# Patient Record
Sex: Female | Born: 1955 | Race: White | Hispanic: No | Marital: Married | Smoking: Never smoker
Health system: Southern US, Community
[De-identification: ages and names within clinical notes are randomized; demographics above are authoritative.]

## PROBLEM LIST (undated history)

## (undated) DIAGNOSIS — M1990 Unspecified osteoarthritis, unspecified site: Secondary | ICD-10-CM

## (undated) DIAGNOSIS — T7840XA Allergy, unspecified, initial encounter: Secondary | ICD-10-CM

## (undated) DIAGNOSIS — J329 Chronic sinusitis, unspecified: Secondary | ICD-10-CM

## (undated) DIAGNOSIS — J302 Other seasonal allergic rhinitis: Secondary | ICD-10-CM

## (undated) DIAGNOSIS — J3089 Other allergic rhinitis: Secondary | ICD-10-CM

## (undated) DIAGNOSIS — J45909 Unspecified asthma, uncomplicated: Secondary | ICD-10-CM

## (undated) HISTORY — DX: Unspecified osteoarthritis, unspecified site: M19.90

## (undated) HISTORY — DX: Other seasonal allergic rhinitis: J30.2

## (undated) HISTORY — DX: Allergy, unspecified, initial encounter: T78.40XA

## (undated) HISTORY — DX: Unspecified asthma, uncomplicated: J45.909

## (undated) HISTORY — PX: KNEE CARTILAGE SURGERY: SHX688

---

## 2003-03-18 ENCOUNTER — Other Ambulatory Visit: Admission: RE | Admit: 2003-03-18 | Discharge: 2003-03-18 | Payer: Self-pay | Admitting: Obstetrics and Gynecology

## 2003-03-25 ENCOUNTER — Encounter: Payer: Self-pay | Admitting: Obstetrics and Gynecology

## 2003-03-25 ENCOUNTER — Encounter: Admission: RE | Admit: 2003-03-25 | Discharge: 2003-03-25 | Payer: Self-pay | Admitting: Obstetrics and Gynecology

## 2004-04-30 ENCOUNTER — Encounter: Admission: RE | Admit: 2004-04-30 | Discharge: 2004-04-30 | Payer: Self-pay | Admitting: Obstetrics and Gynecology

## 2010-09-05 ENCOUNTER — Encounter: Payer: Self-pay | Admitting: Obstetrics and Gynecology

## 2012-08-15 ENCOUNTER — Telehealth: Payer: Self-pay

## 2012-08-15 NOTE — Telephone Encounter (Signed)
Pt is checking on refill request of advair  Please call  [phone]

## 2012-08-16 NOTE — Telephone Encounter (Signed)
Needs appt, not seen in 1 yr left message for her to advise.

## 2012-08-18 ENCOUNTER — Ambulatory Visit (INDEPENDENT_AMBULATORY_CARE_PROVIDER_SITE_OTHER): Payer: BC Managed Care – PPO | Admitting: Family Medicine

## 2012-08-18 VITALS — BP 122/89 | HR 80 | Temp 98.3°F | Resp 18 | Ht 68.0 in | Wt 155.0 lb

## 2012-08-18 DIAGNOSIS — J45909 Unspecified asthma, uncomplicated: Secondary | ICD-10-CM

## 2012-08-18 DIAGNOSIS — J309 Allergic rhinitis, unspecified: Secondary | ICD-10-CM | POA: Insufficient documentation

## 2012-08-18 MED ORDER — FLUTICASONE-SALMETEROL 100-50 MCG/DOSE IN AEPB: 1.0000 | INHALATION_SPRAY | Freq: Two times a day (BID) | RESPIRATORY_TRACT | Status: DC

## 2012-08-18 NOTE — Progress Notes (Signed)
[address]   [place], [place]  [address]   [phone]  Subjective:    Patient ID: Mary Humphrey, female    DOB: October 14, 1955, 57 y.o.   MRN: [id number]  HPIThis 57 y.o. female presents for follow-up on the following:  1.  Asthma:  Worsens in the winter. Uses intermittently; uses fall and winter.  Asthma two years; diagnosed due to coughing excessively.  Has Albuterol rarely.  Exercise avoiding due to coughing.  No flu vaccine; never has received flu vaccine.  No tobacco.  Sister with childhood asthma; did not have issues until moving to [facility].   Does not perform Peak Flows; not aware of baseline.  Recovering from recent URI.  Cold weather also exacerbates coughing.    2.  Allergic rhinitis: stable with Zyrtec D.  No recent issues; allergies worse in fall and spring.  Worsening allergy symptoms when moved to [place] two years ago.  PCP: [facility]; scheduled for CPE on 08/28/12.   Review of Systems  Constitutional: Negative for fever, chills, diaphoresis and fatigue.  HENT: Negative for ear pain, congestion, sore throat, rhinorrhea, sneezing, mouth sores, trouble swallowing, voice change, postnasal drip, sinus pressure and ear discharge.   Respiratory: Positive for cough. Negative for chest tightness, shortness of breath, wheezing and stridor.   Cardiovascular: Positive for chest pain. Negative for palpitations.        Past Medical History  Diagnosis Date  . Allergy   . Asthma     No past surgical history on file.  Prior to Admission medications   Medication Sig Start Date End Date Taking? Authorizing Provider  cetirizine (ZYRTEC) 10 MG tablet Take 10 mg by mouth daily.   Yes Historical Provider, MD  Fluticasone-Salmeterol (ADVAIR) 100-50 MCG/DOSE AEPB Inhale 1 puff into the lungs every 12 (twelve) hours. 08/18/12  Yes Ethelda Chick, MD    No Known Allergies  History   Social History  . Marital Status: Single    Spouse Name: N/A    Number of Children: N/A  . Years of Education: N/A     Occupational History  . Not on file.   Social History Main Topics  . Smoking status: Never Smoker   . Smokeless tobacco: Not on file  . Alcohol Use: Yes  . Drug Use: No  . Sexually Active: Yes    Birth Control/ Protection: Post-menopausal   Other Topics Concern  . Not on file   Social History Narrative   Marital status:  Married x 33 years   Children: two children; no grandchildren   Lives: with husband   Employment:  Chiropodist at [employer]  Tobacco: none   Alcohol:  Socially three times weekly   Exercise: none    Family History  Problem Relation Age of Onset  . Asthma Sister     Objective:   Physical Exam  Nursing note and vitals reviewed. Constitutional: She is oriented to person, place, and time. She appears well-developed and well-nourished. No distress.  HENT:  Head: Normocephalic and atraumatic.  Right Ear: External ear normal.  Left Ear: External ear normal.  Nose: Nose normal.  Mouth/Throat: Oropharynx is clear and moist.  Eyes: Conjunctivae normal are normal. Pupils are equal, round, and reactive to light.  Neck: Normal range of motion. Neck supple.  Cardiovascular: Normal rate, regular rhythm and normal heart sounds.   No murmur heard. Pulmonary/Chest: Effort normal and breath sounds normal. No respiratory distress. She has no wheezes. She has no rales.  Musculoskeletal: She  exhibits no edema.  Lymphadenopathy:    She has no cervical adenopathy.  Neurological: She is alert and oriented to person, place, and time.  Skin: She is not diaphoretic.  Psychiatric: She has a normal mood and affect. Her behavior is normal.    PEAK FLOWS: 360, 360, 360      Assessment & Plan:   1. Asthma  Fluticasone-Salmeterol (ADVAIR) 100-50 MCG/DOSE AEPB  2. Allergic rhinitis      1.  Asthma:  Mild intermittent.   Educated on chronic disease state.  Pt declined flu vaccine.  Encouraged regular exercise; encourage use of Advair or Albuterol prior to exercise.   2.   Allergic Rhinitis: Stable; continue Zyrtec daily.  No recent issues.    Meds ordered this encounter  Medications  . DISCONTD: Fluticasone-Salmeterol (ADVAIR) 100-50 MCG/DOSE AEPB    Sig: Inhale 1 puff into the lungs every 12 (twelve) hours.  . cetirizine (ZYRTEC) 10 MG tablet    Sig: Take 10 mg by mouth daily.  . Fluticasone-Salmeterol (ADVAIR) 100-50 MCG/DOSE AEPB    Sig: Inhale 1 puff into the lungs every 12 (twelve) hours.    Dispense:  60 each    Refill:  11

## 2012-08-18 NOTE — Patient Instructions (Addendum)
1. Asthma  Fluticasone-Salmeterol (ADVAIR) 100-50 MCG/DOSE AEPB  2. Allergic rhinitis

## 2012-09-01 ENCOUNTER — Encounter: Payer: Self-pay | Admitting: Family Medicine

## 2012-09-22 ENCOUNTER — Encounter: Payer: BC Managed Care – PPO | Admitting: Family Medicine

## 2012-10-07 ENCOUNTER — Ambulatory Visit (INDEPENDENT_AMBULATORY_CARE_PROVIDER_SITE_OTHER): Payer: BC Managed Care – PPO | Admitting: Emergency Medicine

## 2012-10-07 VITALS — BP 133/86 | HR 78 | Temp 98.1°F | Resp 16 | Ht 66.5 in | Wt 155.0 lb

## 2012-10-07 DIAGNOSIS — L0100 Impetigo, unspecified: Secondary | ICD-10-CM

## 2012-10-07 MED ORDER — MUPIROCIN CALCIUM 2 % EX CREA: TOPICAL_CREAM | Freq: Three times a day (TID) | CUTANEOUS | Status: DC

## 2012-10-07 MED ORDER — SULFAMETHOXAZOLE-TRIMETHOPRIM 800-160 MG PO TABS: 1.0000 | ORAL_TABLET | Freq: Two times a day (BID) | ORAL | Status: DC

## 2012-10-07 NOTE — Patient Instructions (Addendum)
Impetigo  Impetigo is an infection of the skin, most common in babies and children.   CAUSES   It is caused by staphylococcal or streptococcal germs (bacteria). Impetigo can start after any damage to the skin. The damage to the skin may be from things like:    Chickenpox.   Scrapes.   Scratches.   Insect bites (common when children scratch the bite).   Cuts.   Nail biting or chewing.  Impetigo is contagious. It can be spread from one person to another. Avoid close skin contact, or sharing towels or clothing.  SYMPTOMS   Impetigo usually starts out as small blisters or pustules. Then they turn into tiny yellow-crusted sores (lesions).   There may also be:   Large blisters.   Itching or pain.   Pus.   Swollen lymph glands.  With scratching, irritation, or non-treatment, these small areas may get larger. Scratching can cause the germs to get under the fingernails; then scratching another part of the skin can cause the infection to be spread there.  DIAGNOSIS   Diagnosis of impetigo is usually made by a physical exam. A skin culture (test to grow bacteria) may be done to prove the diagnosis or to help decide the best treatment.   TREATMENT   Mild impetigo can be treated with prescription antibiotic cream. Oral antibiotic medicine may be used in more severe cases. Medicines for itching may be used.  HOME CARE INSTRUCTIONS    To avoid spreading impetigo to other body areas:   Keep fingernails short and clean.   Avoid scratching.   Cover infected areas if necessary to keep from scratching.   Gently wash the infected areas with antibiotic soap and water.   Soak crusted areas in warm soapy water using antibiotic soap.   Gently rub the areas to remove crusts. Do not scrub.   Wash hands often to avoid spread this infection.   Keep children with impetigo home from school or daycare until they have used an antibiotic cream for 48 hours (2 days) or oral antibiotic medicine for 24 hours (1 day), and their skin  shows significant improvement.   Children may attend school or daycare if they only have a few sores and if the sores can be covered by a bandage or clothing.  SEEK MEDICAL CARE IF:    More blisters or sores show up despite treatment.   Other family members get sores.   Rash is not improving after 48 hours (2 days) of treatment.  SEEK IMMEDIATE MEDICAL CARE IF:    You see spreading redness or swelling of the skin around the sores.   You see red streaks coming from the sores.   Your child develops a fever of 100.4 F (37.2 C) or higher.   Your child develops a sore throat.   Your child is acting ill (lethargic, sick to their stomach).  Document Released: 07/30/2000 Document Revised: 10/25/2011 Document Reviewed: 05/29/2008  Exit[employer] Patient Information 2013 [employer].

## 2012-10-07 NOTE — Progress Notes (Signed)
[facility] [address] [place] [address] [phone]  Date:  10/07/2012   Name:  Mary Humphrey   DOB:  10/17/1955   MRN:  [id number]  PCP:  Norberto Sorenson, MD    Chief Complaint: Rash   History of Present Illness:  Mary Humphrey is a 57 y.o. very pleasant female patient who presents with the following:  Had a pruritic lesions on her legs that have become infected over the past month.  No pain or fever or chills.  No drainage.  No antecedent injury or infection.  Patient Active Problem List  Diagnosis  . Asthma  . Allergic rhinitis    Past Medical History  Diagnosis Date  . Allergy   . Asthma     No past surgical history on file.  History  Substance Use Topics  . Smoking status: Never Smoker   . Smokeless tobacco: Not on file  . Alcohol Use: Yes    Family History  Problem Relation Age of Onset  . Asthma Sister     No Known Allergies  Medication list has been reviewed and updated.  Current Outpatient Prescriptions on File Prior to Visit  Medication Sig Dispense Refill  . cetirizine (ZYRTEC) 10 MG tablet Take 10 mg by mouth daily.      . Fluticasone-Salmeterol (ADVAIR) 100-50 MCG/DOSE AEPB Inhale 1 puff into the lungs every 12 (twelve) hours.  60 each  11   No current facility-administered medications on file prior to visit.    Review of Systems:  As per HPI, otherwise negative.    Physical Examination: Filed Vitals:   10/07/12 1155  BP: 133/86  Pulse: 78  Temp: 98.1 F (36.7 C)  Resp: 16   Filed Vitals:   10/07/12 1155  Height: 5' 6.5" (1.689 m)  Weight: 155 lb (70.308 kg)   Body mass index is 24.65 kg/(m^2). Ideal Body Weight: Weight in (lb) to have BMI = 25: 156.9   GEN: WDWN, NAD, Non-toxic, Alert & Oriented x 3 HEENT: Atraumatic, Normocephalic.  Ears and Nose: No external deformity. EXTR: No clubbing/cyanosis/edema NEURO: Normal gait.  PSYCH: Normally interactive. Conversant. Not depressed or anxious  appearing.  Calm demeanor.  SKIN:  Lesions characteristic of impetigo  Assessment and Plan: Impetigo Septra ds Arlyce Harman, MD

## 2012-10-09 ENCOUNTER — Telehealth: Payer: Self-pay

## 2012-10-09 DIAGNOSIS — L0100 Impetigo, unspecified: Secondary | ICD-10-CM

## 2012-10-09 MED ORDER — DOXYCYCLINE HYCLATE 100 MG PO CAPS: 100.0000 mg | ORAL_CAPSULE | Freq: Two times a day (BID) | ORAL | Status: DC

## 2012-10-09 NOTE — Telephone Encounter (Signed)
medcines prescribed recently are upsetting patient stomach, she would like something more gentle   [facility] [address]   cbn  [phone]

## 2012-10-13 ENCOUNTER — Ambulatory Visit: Payer: BC Managed Care – PPO | Admitting: Family Medicine

## 2012-10-26 ENCOUNTER — Telehealth: Payer: Self-pay

## 2012-10-26 DIAGNOSIS — J309 Allergic rhinitis, unspecified: Secondary | ICD-10-CM

## 2012-10-26 NOTE — Telephone Encounter (Signed)
Pt would like to be referred to an allergist [phone]

## 2012-10-29 NOTE — Telephone Encounter (Signed)
Please make the referral

## 2012-10-30 NOTE — Telephone Encounter (Signed)
Referral made. Patient advised.

## 2012-11-17 ENCOUNTER — Encounter: Payer: BC Managed Care – PPO | Admitting: Family Medicine

## 2012-12-19 ENCOUNTER — Other Ambulatory Visit: Payer: Self-pay | Admitting: Emergency Medicine

## 2013-12-14 ENCOUNTER — Ambulatory Visit (INDEPENDENT_AMBULATORY_CARE_PROVIDER_SITE_OTHER): Payer: BC Managed Care – PPO | Admitting: Family Medicine

## 2013-12-14 ENCOUNTER — Encounter: Payer: Self-pay | Admitting: Family Medicine

## 2013-12-14 VITALS — BP 120/76 | HR 62 | Temp 97.7°F | Resp 16 | Ht 66.5 in | Wt 161.2 lb

## 2013-12-14 DIAGNOSIS — L303 Infective dermatitis: Secondary | ICD-10-CM

## 2013-12-14 DIAGNOSIS — L0889 Other specified local infections of the skin and subcutaneous tissue: Secondary | ICD-10-CM

## 2013-12-14 MED ORDER — MUPIROCIN CALCIUM 2 % EX CREA: TOPICAL_CREAM | Freq: Three times a day (TID) | CUTANEOUS | Status: DC

## 2013-12-14 MED ORDER — DESONIDE 0.05 % EX OINT: 1.0000 "application " | TOPICAL_OINTMENT | Freq: Two times a day (BID) | CUTANEOUS | Status: DC

## 2013-12-14 NOTE — Patient Instructions (Addendum)
Eczema Eczema, also called atopic dermatitis, is a skin disorder that causes inflammation of the skin. It causes a red rash and dry, scaly skin. The skin becomes very itchy. Eczema is generally worse during the cooler winter months and often improves with the warmth of summer. Eczema usually starts showing signs in infancy. Some children outgrow eczema, but it may last through adulthood.  CAUSES  The exact cause of eczema is not known, but it appears to run in families. People with eczema often have a family history of eczema, allergies, asthma, or hay fever. Eczema is not contagious. Flare-ups of the condition may be caused by:   Contact with something you are sensitive or allergic to.   Stress. SIGNS AND SYMPTOMS  Dry, scaly skin.   Red, itchy rash.   Itchiness. This may occur before the skin rash and may be very intense.  DIAGNOSIS  The diagnosis of eczema is usually made based on symptoms and medical history. TREATMENT  Eczema cannot be cured, but symptoms usually can be controlled with treatment and other strategies. A treatment plan might include:  Controlling the itching and scratching.   Use over-the-counter antihistamines as directed for itching. This is especially useful at night when the itching tends to be worse.   Use over-the-counter steroid creams as directed for itching.   Avoid scratching. Scratching makes the rash and itching worse. It may also result in a skin infection (impetigo) due to a break in the skin caused by scratching.   Keeping the skin well moisturized with creams every day. This will seal in moisture and help prevent dryness. Lotions that contain alcohol and water should be avoided because they can dry the skin.   Limiting exposure to things that you are sensitive or allergic to (allergens).   Recognizing situations that cause stress.   Developing a plan to manage stress.  HOME CARE INSTRUCTIONS   Only take over-the-counter or  prescription medicines as directed by your health care provider.   Do not use anything on the skin without checking with your health care provider.   Keep baths or showers short (5 minutes) in warm (not hot) water. Use mild cleansers for bathing. These should be unscented. You may add nonperfumed bath oil to the bath water. It is best to avoid soap and bubble bath.   Immediately after a bath or shower, when the skin is still damp, apply a moisturizing ointment to the entire body. This ointment should be a petroleum ointment. This will seal in moisture and help prevent dryness. The thicker the ointment, the better. These should be unscented.   Keep fingernails cut short. Children with eczema may need to wear soft gloves or mittens at night after applying an ointment.   Dress in clothes made of cotton or cotton blends. Dress lightly, because heat increases itching.   A child with eczema should stay away from anyone with fever blisters or cold sores. The virus that causes fever blisters (herpes simplex) can cause a serious skin infection in children with eczema. SEEK MEDICAL CARE IF:   Your itching interferes with sleep.   Your rash gets worse or is not better within 1 week after starting treatment.   You see pus or soft yellow scabs in the rash area.   You have a fever.   You have a rash flare-up after contact with someone who has fever blisters.  Document Released: 07/30/2000 Document Revised: 05/23/2013 Document Reviewed: 03/05/2013 [facility] Patient Information 2014 [place].  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  Use this topical steroid ointment very sparingly and use mild soap or cleansing agent to prevent drying of affected skin.

## 2013-12-14 NOTE — Progress Notes (Signed)
S: This 58 y.o. Cauc female has a recurrent rash for last 12+ months on anterior aspect of lower legs. Previous episode 3 months ago was diagnosed as impetigo and improved after oral antibiotic (and topical antibiotic cream. Recurrence 1 month ago w/ itching and redness. No hx of eczema as a child. No exposure to pets, plants or chemicals.   Pt has allergic diathesis; she receives allergy injections for severe allergies and has moderate Asthma. She denies fever/chills, fatigue, sore throat, cough, SOB, HA, arthralgias or weakness.  Patient Active Problem List   Diagnosis Date Noted  . Asthma 08/18/2012  . Allergic rhinitis 08/18/2012    Prior to Admission medications   Medication Sig Start Date End Date Taking? Authorizing Provider  albuterol (PROVENTIL HFA;VENTOLIN HFA) 108 (90 BASE) MCG/ACT inhaler Inhale into the lungs every 6 (six) hours as needed for wheezing or shortness of breath.   Yes Historical Provider, MD  azelastine (ASTELIN) 0.1 % nasal spray Place into both nostrils 2 (two) times daily. Use in each nostril as directed   Yes Historical Provider, MD  cetirizine (ZYRTEC) 10 MG tablet Take 10 mg by mouth daily.   Yes Historical Provider, MD  EPINEPHrine 0.3 mg/0.3 mL IJ SOAJ injection Inject into the muscle once.   Yes Historical Provider, MD  fluticasone (FLONASE) 50 MCG/ACT nasal spray Place into both nostrils daily.   Yes Historical Provider, MD  Fluticasone-Salmeterol (ADVAIR) 100-50 MCG/DOSE AEPB Inhale 1 puff into the lungs every 12 (twelve) hours. 08/18/12  Yes Wardell Honour, MD  Montelukast Sodium (SINGULAIR PO) Take by mouth.   Yes Historical Provider, MD  Olopatadine HCl (PATADAY) 0.2 % SOLN Apply to eye.   Yes Historical Provider, MD          PMHx, Surg Hx, Soc and Fam Hx reviewed.  ROS: As per HPI.  O: Filed Vitals:   12/14/13 1028  BP: 120/76  Pulse: 62  Temp: 97.7 F (36.5 C)  Resp: 16   GEN: In NAD; WN,WD. HENT: [place]/AT; EOMI w/ clear conj/sclerae. Otherwise  unremarkable. COR: RRR. LUNGS: Unlabored resp. SKIN: R lower leg- maculopapular rash with erythema and ill-defined edge. Hyperpigmentation in areas of previous outbreak. L lower leg- hyperpigmented scarring and few faint red papules. NEURO: A&O x 3; CNs intact. Nonfocal.  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 Apply sparingly bid. Keep skin hydrated.

## 2014-04-03 ENCOUNTER — Other Ambulatory Visit (INDEPENDENT_AMBULATORY_CARE_PROVIDER_SITE_OTHER): Payer: Self-pay | Admitting: Otolaryngology

## 2014-04-03 DIAGNOSIS — J329 Chronic sinusitis, unspecified: Secondary | ICD-10-CM

## 2014-04-09 ENCOUNTER — Ambulatory Visit
Admission: RE | Admit: 2014-04-09 | Discharge: 2014-04-09 | Disposition: A | Discharge status: Home / Self Care | Payer: BC Managed Care – PPO | Source: Ambulatory Visit | Attending: Otolaryngology | Admitting: Otolaryngology

## 2014-04-09 DIAGNOSIS — J329 Chronic sinusitis, unspecified: Secondary | ICD-10-CM

## 2014-04-17 ENCOUNTER — Encounter: Payer: Self-pay | Admitting: Family Medicine

## 2014-04-17 ENCOUNTER — Ambulatory Visit (INDEPENDENT_AMBULATORY_CARE_PROVIDER_SITE_OTHER): Payer: BC Managed Care – PPO | Admitting: Family Medicine

## 2014-04-17 VITALS — BP 120/90 | HR 73 | Temp 97.6°F | Resp 16 | Ht 66.5 in | Wt 161.6 lb

## 2014-04-17 DIAGNOSIS — Z13220 Encounter for screening for lipoid disorders: Secondary | ICD-10-CM

## 2014-04-17 DIAGNOSIS — Z1212 Encounter for screening for malignant neoplasm of rectum: Secondary | ICD-10-CM

## 2014-04-17 DIAGNOSIS — Z124 Encounter for screening for malignant neoplasm of cervix: Secondary | ICD-10-CM

## 2014-04-17 DIAGNOSIS — Z0000 Encounter for general adult medical examination without abnormal findings: Secondary | ICD-10-CM

## 2014-04-17 DIAGNOSIS — Z01419 Encounter for gynecological examination (general) (routine) without abnormal findings: Secondary | ICD-10-CM

## 2014-04-17 DIAGNOSIS — Z1211 Encounter for screening for malignant neoplasm of colon: Secondary | ICD-10-CM

## 2014-04-17 DIAGNOSIS — Z8639 Personal history of other endocrine, nutritional and metabolic disease: Secondary | ICD-10-CM

## 2014-04-17 LAB — POCT URINALYSIS DIPSTICK
Bilirubin, UA: NEGATIVE
Glucose, UA: NEGATIVE
Ketones, UA: NEGATIVE
Nitrite, UA: NEGATIVE
Protein, UA: NEGATIVE
Spec Grav, UA: 1.01
Urobilinogen, UA: 0.2
pH, UA: 5.5

## 2014-04-17 LAB — POCT UA - MICROSCOPIC ONLY
Casts, Ur, LPF, POC: NEGATIVE
Crystals, Ur, HPF, POC: NEGATIVE
Epithelial cells, urine per micros: NEGATIVE
Mucus, UA: NEGATIVE
Yeast, UA: NEGATIVE

## 2014-04-17 NOTE — Progress Notes (Signed)
Subjective:    Patient ID: Mary Humphrey, female    DOB: 12-06-1955, 58 y.o.   MRN: [id number]  HPI  This 58 y.o. Cauc female is here for CPE/ PAP w/ pelvic. Most significant chronic medical problem is nasal polyps associated with allergies and chronic sinusitis; this problem is managed by Allergy Specialist and ENT.   HCM: MMG- Current.           CRS- Not yet; agrees to referral to GI.           IMM- Current.           Vision- Annually or biannually; wears corrective lenses.   Patient Active Problem List   Diagnosis Date Noted  . 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 Asthma 08/18/2012  . Allergic rhinitis 08/18/2012    Prior to Admission medications   Medication Sig Start Date End Date Taking? Authorizing Provider  albuterol (PROVENTIL HFA;VENTOLIN HFA) 108 (90 BASE) MCG/ACT inhaler Inhale into the lungs every 6 (six) hours as needed for wheezing or shortness of breath.   Yes Historical Provider, MD  azelastine (ASTELIN) 0.1 % nasal spray Place into both nostrils 2 (two) times daily. Use in each nostril as directed   Yes Historical Provider, MD  cetirizine (ZYRTEC) 10 MG tablet Take 10 mg by mouth daily.   Yes Historical Provider, MD  desonide (DESOWEN) 0.05 % ointment Apply 1 application topically 2 (two) times daily. 12/14/13  Yes Barton Fanny, MD  EPINEPHrine 0.3 mg/0.3 mL IJ SOAJ injection Inject into the muscle once.   Yes Historical Provider, MD  fluticasone (FLONASE) 50 MCG/ACT nasal spray Place into both nostrils daily.   Yes Historical Provider, MD  Fluticasone-Salmeterol (ADVAIR) 100-50 MCG/DOSE AEPB Inhale 1 puff into the lungs every 12 (twelve) hours. 08/18/12  Yes Wardell Honour, MD  Montelukast Sodium (SINGULAIR PO) Take by mouth.   Yes Historical Provider, MD  Olopatadine HCl (PATADAY) 0.2 % SOLN Apply to eye.    Historical Provider, MD    History   Social History  . Marital Status: Single    Spouse Name: N/A    Number of Children: N/A  . Years of Education: N/A    Occupational History  . Administration [employer]   Social History Main Topics  . Smoking status: Never Smoker   . Smokeless tobacco: Not on file  . Alcohol Use: Yes     Comment: 2-3 drinks  . Drug Use: No  . Sexual Activity: Yes    Birth Control/ Protection: Post-menopausal   Other Topics Concern  . Not on file   Social History Narrative   Marital status:  Married x 33 years      Children: two children; no grandchildren      Lives: with husband      Employment:  Surveyor, quantity at [employer]     Tobacco: none      Alcohol:  Socially three times weekly      Exercise: none    Family History  Problem Relation Age of Onset  . Asthma Sister   . Heart disease Father          Review of Systems  Constitutional: Negative.   HENT: Negative.   Eyes: Negative.   Respiratory: Negative.   Cardiovascular: Negative.   Gastrointestinal: Negative.   Endocrine: Negative.   Genitourinary: Negative.   Musculoskeletal: Negative.   Skin: Negative.   Allergic/Immunologic: Positive for environmental allergies and food allergies.  Neurological: Negative.   Hematological: Negative.  Psychiatric/Behavioral: Negative.       Objective:   Physical Exam  Nursing note and vitals reviewed. Constitutional: She is oriented to person, place, and time. Vital signs are normal. She appears well-developed and well-nourished. No distress.  HENT:  Head: Normocephalic and atraumatic.  Right Ear: Hearing, tympanic membrane, external ear and ear canal normal.  Left Ear: Hearing, tympanic membrane, external ear and ear canal normal.  Nose: Nose normal. No mucosal edema, rhinorrhea, nasal deformity or septal deviation.  Mouth/Throat: Uvula is midline, oropharynx is clear and moist and mucous membranes are normal. No oral lesions. Normal dentition. No dental caries.  Eyes: Conjunctivae, EOM and lids are normal. Pupils are equal, round, and reactive to light. No scleral icterus.  Neck: Trachea normal, normal  range of motion, full passive range of motion without pain and phonation normal. Neck supple. No JVD present. No spinous process tenderness and no muscular tenderness present. No mass and no thyromegaly present.  Cardiovascular: Normal rate, regular rhythm, S1 normal, S2 normal, normal heart sounds and normal pulses.   No extrasystoles are present. PMI is not displaced.  Exam reveals no gallop and no friction rub.   No murmur heard. Pulmonary/Chest: Effort normal and breath sounds normal. No respiratory distress. Right breast exhibits no inverted nipple, no mass, no nipple discharge, no skin change and no tenderness. Left breast exhibits no inverted nipple, no mass, no nipple discharge, no skin change and no tenderness. Breasts are symmetrical.  Abdominal: Soft. Normal appearance, normal aorta and bowel sounds are normal. She exhibits no distension, no pulsatile midline mass and no mass. There is no hepatosplenomegaly. There is no tenderness. There is no guarding and no CVA tenderness.  Genitourinary: Rectum normal, vagina normal and uterus normal. There is no rash, tenderness or lesion on the right labia. There is no rash, tenderness or lesion on the left labia. Uterus is not deviated, not enlarged, not fixed and not tender. Cervix exhibits discharge. Cervix exhibits no motion tenderness. Right adnexum displays no mass, no tenderness and no fullness. Left adnexum displays no mass, no tenderness and no fullness.  Musculoskeletal:       Cervical back: Normal.       Thoracic back: Normal.       Lumbar back: Normal.  Remainder of exam normal.  Lymphadenopathy:       Head (right side): No submental, no submandibular, no tonsillar, no preauricular, no posterior auricular and no occipital adenopathy present.       Head (left side): No submental, no submandibular, no tonsillar, no preauricular, no posterior auricular and no occipital adenopathy present.    She has no cervical adenopathy.    She has no  axillary adenopathy.       Right: No inguinal and no supraclavicular adenopathy present.       Left: No inguinal and no supraclavicular adenopathy present.  Neurological: She is alert and oriented to person, place, and time. She has normal strength and normal reflexes. She displays no atrophy and no tremor. No cranial nerve deficit or sensory deficit. She exhibits normal muscle tone. She displays a negative Romberg sign. Coordination and gait normal.  Skin: Skin is warm, dry and intact. No ecchymosis, no lesion and no rash noted. She is not diaphoretic. No cyanosis or erythema. No pallor. Nails show no clubbing.  Psychiatric: She has a normal mood and affect. Her speech is normal and behavior is normal. Judgment and thought content normal. Cognition and memory are normal.    Results  for orders placed in visit on 04/17/14  POCT URINALYSIS DIPSTICK      Result Value Ref Range   Color, UA yellow     Clarity, UA clear     Glucose, UA neg     Bilirubin, UA neg     Ketones, UA neg     Spec Grav, UA 1.010     Blood, UA trace     pH, UA 5.5     Protein, UA neg     Urobilinogen, UA 0.2     Nitrite, UA neg     Leukocytes, UA Trace         Assessment & Plan:  Routine general medical examination at a health care facility - Plan: Thyroid Panel With TSH, COMPLETE METABOLIC PANEL WITH GFR, CBC with Differential, IFOBT POC (occult bld, rslt in office), POCT UA - Microscopic Only, POCT urinalysis dipstick  Encounter for cervical Pap smear with pelvic exam - Plan: Pap IG (Image Guided)  History of vitamin D deficiency - Plan: Vit D  25 hydroxy (rtn osteoporosis monitoring)  Need for lipid screening - Plan: Lipid panel  Screening for colorectal cancer - Plan: Ambulatory referral to Gastroenterology

## 2014-04-17 NOTE — Patient Instructions (Addendum)
Mary Humphrey has a line of multivitamins that he has formulated. You can purchase these online as [email or web address]. He has formulations for Women, Post-Menopausal Women and other lines depending on your particular physical conditions. Also, Omega 3 Fish Oil is important for heart health and overall well-being. [employer] is a good brand or you can try the [facility] brand (samples given).

## 2014-04-17 NOTE — Progress Notes (Deleted)
   Subjective:    Patient ID: Mary Humphrey, female    DOB: 07/25/1956, 58 y.o.   MRN: [id number]  HPI  This 58 y.o. Cauc female is here for CPE/PAP.     Review of Systems     Objective:   Physical Exam        Assessment & Plan:

## 2014-04-18 LAB — CBC WITH DIFFERENTIAL/PLATELET
Basophils Absolute: 0.1 10*3/uL (ref 0.0–0.1)
Basophils Relative: 1 % (ref 0–1)
Eosinophils Absolute: 0.4 10*3/uL (ref 0.0–0.7)
Eosinophils Relative: 7 % — ABNORMAL HIGH (ref 0–5)
HCT: 40.1 % (ref 36.0–46.0)
Hemoglobin: 13.4 g/dL (ref 12.0–15.0)
Lymphocytes Relative: 34 % (ref 12–46)
Lymphs Abs: 1.8 10*3/uL (ref 0.7–4.0)
MCH: 30.2 pg (ref 26.0–34.0)
MCHC: 33.4 g/dL (ref 30.0–36.0)
MCV: 90.5 fL (ref 78.0–100.0)
Monocytes Absolute: 0.4 10*3/uL (ref 0.1–1.0)
Monocytes Relative: 8 % (ref 3–12)
Neutro Abs: 2.7 10*3/uL (ref 1.7–7.7)
Neutrophils Relative %: 50 % (ref 43–77)
Platelets: 271 10*3/uL (ref 150–400)
RBC: 4.43 MIL/uL (ref 3.87–5.11)
RDW: 13.7 % (ref 11.5–15.5)
WBC: 5.3 10*3/uL (ref 4.0–10.5)

## 2014-04-18 LAB — COMPLETE METABOLIC PANEL WITH GFR
ALT: 13 U/L (ref 0–35)
AST: 16 U/L (ref 0–37)
Albumin: 4.6 g/dL (ref 3.5–5.2)
Alkaline Phosphatase: 62 U/L (ref 39–117)
BUN: 12 mg/dL (ref 6–23)
CO2: 24 mEq/L (ref 19–32)
Calcium: 9.4 mg/dL (ref 8.4–10.5)
Chloride: 105 mEq/L (ref 96–112)
Creat: 0.71 mg/dL (ref 0.50–1.10)
GFR, Est African American: >89 mL/min
GFR, Est Non African American: >89 mL/min
Glucose, Bld: 100 mg/dL — ABNORMAL HIGH (ref 70–99)
Potassium: 4 mEq/L (ref 3.5–5.3)
Sodium: 140 mEq/L (ref 135–145)
Total Bilirubin: 1.4 mg/dL — ABNORMAL HIGH (ref 0.2–1.2)
Total Protein: 7 g/dL (ref 6.0–8.3)

## 2014-04-18 LAB — PAP IG (IMAGE GUIDED)

## 2014-04-18 LAB — LIPID PANEL
Cholesterol: 195 mg/dL (ref 0–200)
HDL: 62 mg/dL (ref >39)
LDL Cholesterol: 120 mg/dL — ABNORMAL HIGH (ref 0–99)
Total CHOL/HDL Ratio: 3.1 Ratio
Triglycerides: 67 mg/dL (ref <150)
VLDL: 13 mg/dL (ref 0–40)

## 2014-04-18 LAB — THYROID PANEL WITH TSH
Free Thyroxine Index: 1.7 (ref 1.4–3.8)
T3 Uptake: 28 % (ref 22.0–35.0)
T4, Total: 5.9 ug/dL (ref 4.5–12.0)
TSH: 0.996 u[IU]/mL (ref 0.350–4.500)

## 2014-04-19 ENCOUNTER — Encounter: Payer: Self-pay | Admitting: Internal Medicine

## 2014-04-19 LAB — VITAMIN D 25 HYDROXY (VIT D DEFICIENCY, FRACTURES): Vit D, 25-Hydroxy: 29 ng/mL — ABNORMAL LOW (ref 30–89)

## 2014-04-21 NOTE — Progress Notes (Signed)
Quick Note:  Please advise pt regarding following labs... All labs are normal. LDL ("bad") cholesterol is above 100; focus on improved nutrition, regular physical activity and the supplements to help increase your Vitamin D level.  PAP is negative/ normal.  Copy to pt. ______

## 2014-05-01 ENCOUNTER — Other Ambulatory Visit: Payer: Self-pay | Admitting: Otolaryngology

## 2014-06-10 ENCOUNTER — Ambulatory Visit (AMBULATORY_SURGERY_CENTER): Payer: Self-pay | Admitting: *Deleted

## 2014-06-10 VITALS — Ht 66.0 in | Wt 166.6 lb

## 2014-06-10 DIAGNOSIS — Z1211 Encounter for screening for malignant neoplasm of colon: Secondary | ICD-10-CM

## 2014-06-10 MED ORDER — MOVIPREP 100 G PO SOLR: 1.0000 | Freq: Once | ORAL | Status: DC

## 2014-06-10 NOTE — Progress Notes (Signed)
No egg or soy allergy. ewm No home 02 use. ewm  only sedation was as a child but no issues with that. ewm No diet pills,no blood thinners. ewm

## 2014-06-17 ENCOUNTER — Encounter: Payer: Self-pay | Admitting: Internal Medicine

## 2014-06-24 ENCOUNTER — Ambulatory Visit (AMBULATORY_SURGERY_CENTER): Payer: BC Managed Care – PPO | Admitting: Internal Medicine

## 2014-06-24 ENCOUNTER — Encounter: Payer: Self-pay | Admitting: Internal Medicine

## 2014-06-24 VITALS — BP 137/48 | HR 54 | Temp 98.4°F | Resp 25 | Ht 66.0 in | Wt 166.0 lb

## 2014-06-24 DIAGNOSIS — D123 Benign neoplasm of transverse colon: Secondary | ICD-10-CM

## 2014-06-24 DIAGNOSIS — D125 Benign neoplasm of sigmoid colon: Secondary | ICD-10-CM

## 2014-06-24 DIAGNOSIS — Z1211 Encounter for screening for malignant neoplasm of colon: Secondary | ICD-10-CM

## 2014-06-24 MED ORDER — SODIUM CHLORIDE 0.9 % IV SOLN: 500.0000 mL | INTRAVENOUS | Status: DC

## 2014-06-24 NOTE — Patient Instructions (Signed)
Discharge instructions given. Handouts on polyps. Resume previous medications. YOU HAD AN ENDOSCOPIC PROCEDURE TODAY AT THE [facility]: Refer to the procedure report that was given to you for any specific questions about what was found during the examination.  If the procedure report does not answer your questions, please call your gastroenterologist to clarify.  If you requested that your care partner not be given the details of your procedure findings, then the procedure report has been included in a sealed envelope for you to review at your convenience later.  YOU SHOULD EXPECT: Some feelings of bloating in the abdomen. Passage of more gas than usual.  Walking can help get rid of the air that was put into your GI tract during the procedure and reduce the bloating. If you had a lower endoscopy (such as a colonoscopy or flexible sigmoidoscopy) you may notice spotting of blood in your stool or on the toilet paper. If you underwent a bowel prep for your procedure, then you may not have a normal bowel movement for a few days.  DIET: Your first meal following the procedure should be a light meal and then it is ok to progress to your normal diet.  A half-sandwich or bowl of soup is an example of a good first meal.  Heavy or fried foods are harder to digest and may make you feel nauseous or bloated.  Likewise meals heavy in dairy and vegetables can cause extra gas to form and this can also increase the bloating.  Drink plenty of fluids but you should avoid alcoholic beverages for 24 hours.  ACTIVITY: Your care partner should take you home directly after the procedure.  You should plan to take it easy, moving slowly for the rest of the day.  You can resume normal activity the day after the procedure however you should NOT DRIVE or use heavy machinery for 24 hours (because of the sedation medicines used during the test).    SYMPTOMS TO REPORT IMMEDIATELY: A gastroenterologist can be reached at any  hour.  During normal business hours, 8:30 AM to 5:00 PM Monday through Friday, call [phone].  After hours and on weekends, please call the GI answering service at [phone] who will take a message and have the physician on call contact you.   Following lower endoscopy (colonoscopy or flexible sigmoidoscopy):  Excessive amounts of blood in the stool  Significant tenderness or worsening of abdominal pains  Swelling of the abdomen that is new, acute  Fever of 100F or higher  FOLLOW UP: If any biopsies were taken you will be contacted by phone or by letter within the next 1-3 weeks.  Call your gastroenterologist if you have not heard about the biopsies in 3 weeks.  Our staff will call the home number listed on your records the next business day following your procedure to check on you and address any questions or concerns that you may have at that time regarding the information given to you following your procedure. This is a courtesy call and so if there is no answer at the home number and we have not heard from you through the emergency physician on call, we will assume that you have returned to your regular daily activities without incident.  SIGNATURES/CONFIDENTIALITY: You and/or your care partner have signed paperwork which will be entered into your electronic medical record.  These signatures attest to the fact that that the information above on your After Visit Summary has been reviewed and is   understood.  Full responsibility of the confidentiality of this discharge information lies with you and/or your care-partner. 

## 2014-06-24 NOTE — Progress Notes (Signed)
A/ox3, pleased with MAC, report to RN 

## 2014-06-24 NOTE — Progress Notes (Signed)
Called to room to assist during endoscopic procedure.  Patient ID and intended procedure confirmed with present staff. Received instructions for my participation in the procedure from the performing physician.  

## 2014-06-24 NOTE — Op Note (Signed)
[facility]  [address]. [place], [address]   COLONOSCOPY PROCEDURE REPORT  PATIENT: Mary, Humphrey  MR#: [id number] BIRTHDATE: 07-Jul-1956 , 57  yrs. old GENDER: female ENDOSCOPIST: Eustace Quail, MD REFERRED WU:[id number] McPherson, M.D. PROCEDURE DATE:  06/24/2014 PROCEDURE:   Colonoscopy with snare polypectomy x 2 First Screening Colonoscopy - Avg.  risk and is 50 yrs.  old or older Yes.  Prior Negative Screening - Now for repeat screening. N/A  History of Adenoma - Now for follow-up colonoscopy & has been > or = to 3 yrs.  N/A  Polyps Removed Today? Yes. ASA CLASS:   Class II INDICATIONS:average risk for colorectal cancer. MEDICATIONS: Monitored anesthesia care and Propofol 240 mg IV  DESCRIPTION OF PROCEDURE:   After the risks benefits and alternatives of the procedure were thoroughly explained, informed consent was obtained.  The digital rectal exam revealed no abnormalities of the rectum.   The LB [id number]  endoscope was introduced through the anus and advanced to the cecum, which was identified by both the appendix and ileocecal valve. No adverse events experienced.   The quality of the prep was excellent, using MoviPrep  The instrument was then slowly withdrawn as the colon was fully examined.    COLON FINDINGS: Two polyps were found in the sigmoid colon (7 mm pedunculated) and transverse colon (33mm sessile).  A polypectomy was performed with a cold snare.  The resection was complete, the polyp tissue was completely retrieved and sent to histology.   The examination was otherwise normal.  Retroflexed views revealed no abnormalities. The time to cecum=2 minutes 33 seconds.  Withdrawal time=9 minutes 24 seconds.  The scope was withdrawn and the procedure completed.  COMPLICATIONS: There were no immediate complications.  ENDOSCOPIC IMPRESSION: 1.   Two polyps were found in the sigmoid colon and transverse colon; polypectomy was performed  with a cold snare 2.   The examination was otherwise normal  RECOMMENDATIONS: 1. Follow up colonoscopy in 5 years  eSigned:  Eustace Quail, MD 06/24/2014 11:21 AM   cc: Ellsworth Lennox, MD and The Patient

## 2014-06-25 ENCOUNTER — Telehealth: Payer: Self-pay | Admitting: *Deleted

## 2014-06-25 NOTE — Telephone Encounter (Signed)
  Follow up Call-  Call back number 06/24/2014  Post procedure Call Back phone  # [phone]  Permission to leave phone message Yes     Patient questions:  Do you have a fever, pain , or abdominal swelling? No. Pain Score  0 *  Have you tolerated food without any problems? Yes.    Have you been able to return to your normal activities? Yes.    Do you have any questions about your discharge instructions: Diet   No. Medications  No. Follow up visit  No.  Do you have questions or concerns about your Care? No.  Actions: * If pain score is 4 or above: No action needed, pain <4.

## 2014-07-03 ENCOUNTER — Encounter: Payer: Self-pay | Admitting: Internal Medicine

## 2014-07-09 ENCOUNTER — Encounter: Payer: Self-pay | Admitting: Family Medicine

## 2014-07-09 DIAGNOSIS — Z86010 Personal history of colonic polyps: Secondary | ICD-10-CM | POA: Insufficient documentation

## 2014-09-03 ENCOUNTER — Encounter (HOSPITAL_BASED_OUTPATIENT_CLINIC_OR_DEPARTMENT_OTHER): Payer: Self-pay | Admitting: *Deleted

## 2014-09-09 ENCOUNTER — Encounter (HOSPITAL_BASED_OUTPATIENT_CLINIC_OR_DEPARTMENT_OTHER)
Admission: RE | Disposition: A | Discharge status: Home / Self Care | Payer: Self-pay | Source: Ambulatory Visit | Attending: Otolaryngology

## 2014-09-09 ENCOUNTER — Ambulatory Visit (HOSPITAL_BASED_OUTPATIENT_CLINIC_OR_DEPARTMENT_OTHER)
Admission: RE | Admit: 2014-09-09 | Discharge: 2014-09-09 | Disposition: A | Discharge status: Home / Self Care | Payer: BC Managed Care – PPO | Source: Ambulatory Visit | Attending: Otolaryngology | Admitting: Otolaryngology

## 2014-09-09 ENCOUNTER — Ambulatory Visit (HOSPITAL_BASED_OUTPATIENT_CLINIC_OR_DEPARTMENT_OTHER): Payer: BC Managed Care – PPO | Admitting: Anesthesiology

## 2014-09-09 ENCOUNTER — Encounter (HOSPITAL_BASED_OUTPATIENT_CLINIC_OR_DEPARTMENT_OTHER): Payer: Self-pay

## 2014-09-09 DIAGNOSIS — J018 Other acute sinusitis: Secondary | ICD-10-CM | POA: Diagnosis not present

## 2014-09-09 DIAGNOSIS — J328 Other chronic sinusitis: Secondary | ICD-10-CM | POA: Insufficient documentation

## 2014-09-09 DIAGNOSIS — J338 Other polyp of sinus: Secondary | ICD-10-CM | POA: Diagnosis not present

## 2014-09-09 DIAGNOSIS — Z881 Allergy status to other antibiotic agents status: Secondary | ICD-10-CM | POA: Insufficient documentation

## 2014-09-09 HISTORY — DX: Chronic sinusitis, unspecified: J32.9

## 2014-09-09 HISTORY — PX: SINUS ENDO W/FUSION: SHX777

## 2014-09-09 HISTORY — DX: Other allergic rhinitis: J30.89

## 2014-09-09 LAB — POCT HEMOGLOBIN-HEMACUE: Hemoglobin: 15 g/dL (ref 12.0–15.0)

## 2014-09-09 SURGERY — SINUS SURGERY, ENDOSCOPIC, USING COMPUTER-ASSISTED NAVIGATION
Anesthesia: General | Site: Nose | Laterality: Bilateral

## 2014-09-09 MED ORDER — MUPIROCIN 2 % EX OINT
TOPICAL_OINTMENT | CUTANEOUS | Status: DC | PRN
  Administered 2014-09-09: 1 via NASAL

## 2014-09-09 MED ORDER — MIDAZOLAM HCL 5 MG/5ML IJ SOLN
INTRAMUSCULAR | Status: DC | PRN
  Administered 2014-09-09: 2 mg via INTRAVENOUS

## 2014-09-09 MED ORDER — BACITRACIN ZINC 500 UNIT/GM EX OINT
TOPICAL_OINTMENT | CUTANEOUS | Status: AC
  Filled 2014-09-09: qty 28.35

## 2014-09-09 MED ORDER — OXYCODONE-ACETAMINOPHEN 5-325 MG PO TABS
1.0000 | ORAL_TABLET | Freq: Once | ORAL | Status: AC | PRN
  Administered 2014-09-09: 1 via ORAL

## 2014-09-09 MED ORDER — HYDROMORPHONE HCL 1 MG/ML IJ SOLN
INTRAMUSCULAR | Status: AC
  Filled 2014-09-09: qty 1

## 2014-09-09 MED ORDER — CEFAZOLIN SODIUM-DEXTROSE 2-3 GM-% IV SOLR
INTRAVENOUS | Status: DC | PRN
  Administered 2014-09-09: 2 g via INTRAVENOUS

## 2014-09-09 MED ORDER — SUFENTANIL CITRATE 50 MCG/ML IV SOLN
INTRAVENOUS | Status: DC | PRN
  Administered 2014-09-09: 5 ug via INTRAVENOUS
  Administered 2014-09-09: 10 ug via INTRAVENOUS

## 2014-09-09 MED ORDER — DEXAMETHASONE SODIUM PHOSPHATE 4 MG/ML IJ SOLN
INTRAMUSCULAR | Status: DC | PRN
  Administered 2014-09-09: 10 mg via INTRAVENOUS

## 2014-09-09 MED ORDER — OXYCODONE-ACETAMINOPHEN 5-325 MG PO TABS
ORAL_TABLET | ORAL | Status: AC
  Filled 2014-09-09: qty 1

## 2014-09-09 MED ORDER — ONDANSETRON HCL 4 MG/2ML IJ SOLN: 4.0000 mg | Freq: Once | INTRAMUSCULAR | Status: DC | PRN

## 2014-09-09 MED ORDER — OXYMETAZOLINE HCL 0.05 % NA SOLN
NASAL | Status: AC
  Filled 2014-09-09: qty 15

## 2014-09-09 MED ORDER — MIDAZOLAM HCL 2 MG/2ML IJ SOLN
INTRAMUSCULAR | Status: AC
  Filled 2014-09-09: qty 2

## 2014-09-09 MED ORDER — LIDOCAINE HCL (CARDIAC) 20 MG/ML IV SOLN
INTRAVENOUS | Status: DC | PRN
  Administered 2014-09-09: 30 mg via INTRAVENOUS

## 2014-09-09 MED ORDER — LIDOCAINE-EPINEPHRINE 1 %-1:100000 IJ SOLN
INTRAMUSCULAR | Status: AC
Start: 2014-09-09 — End: 2014-09-09
  Filled 2014-09-09: qty 1

## 2014-09-09 MED ORDER — SUCCINYLCHOLINE CHLORIDE 20 MG/ML IJ SOLN
INTRAMUSCULAR | Status: AC
  Filled 2014-09-09: qty 1

## 2014-09-09 MED ORDER — MIDAZOLAM HCL 2 MG/2ML IJ SOLN: 1.0000 mg | INTRAMUSCULAR | Status: DC | PRN

## 2014-09-09 MED ORDER — LIDOCAINE HCL 4 % MT SOLN
OROMUCOSAL | Status: DC | PRN
  Administered 2014-09-09: 4 mL via TOPICAL

## 2014-09-09 MED ORDER — LACTATED RINGERS IV SOLN
INTRAVENOUS | Status: DC
  Administered 2014-09-09 (×2): via INTRAVENOUS

## 2014-09-09 MED ORDER — HYDROMORPHONE HCL 1 MG/ML IJ SOLN
0.2500 mg | INTRAMUSCULAR | Status: DC | PRN
  Administered 2014-09-09 (×2): 0.25 mg via INTRAVENOUS
  Administered 2014-09-09 (×2): 0.5 mg via INTRAVENOUS

## 2014-09-09 MED ORDER — OXYMETAZOLINE HCL 0.05 % NA SOLN
NASAL | Status: DC | PRN
  Administered 2014-09-09: 1 via NASAL

## 2014-09-09 MED ORDER — MUPIROCIN 2 % EX OINT
TOPICAL_OINTMENT | CUTANEOUS | Status: AC
  Filled 2014-09-09: qty 22

## 2014-09-09 MED ORDER — MEPERIDINE HCL 25 MG/ML IJ SOLN: 6.2500 mg | INTRAMUSCULAR | Status: DC | PRN

## 2014-09-09 MED ORDER — EPHEDRINE SULFATE 50 MG/ML IJ SOLN
INTRAMUSCULAR | Status: DC | PRN
  Administered 2014-09-09: 10 mg via INTRAVENOUS
  Administered 2014-09-09: 15 mg via INTRAVENOUS

## 2014-09-09 MED ORDER — FENTANYL CITRATE 0.05 MG/ML IJ SOLN: 50.0000 ug | INTRAMUSCULAR | Status: DC | PRN

## 2014-09-09 MED ORDER — PROPOFOL 10 MG/ML IV EMUL
INTRAVENOUS | Status: AC
  Filled 2014-09-09: qty 150

## 2014-09-09 MED ORDER — CEFAZOLIN SODIUM-DEXTROSE 2-3 GM-% IV SOLR
INTRAVENOUS | Status: AC
  Filled 2014-09-09: qty 50

## 2014-09-09 MED ORDER — ONDANSETRON HCL 4 MG/2ML IJ SOLN
INTRAMUSCULAR | Status: DC | PRN
  Administered 2014-09-09: 4 mg via INTRAVENOUS

## 2014-09-09 MED ORDER — SUFENTANIL CITRATE 50 MCG/ML IV SOLN
INTRAVENOUS | Status: AC
  Filled 2014-09-09: qty 1

## 2014-09-09 MED ORDER — PROPOFOL 10 MG/ML IV BOLUS
INTRAVENOUS | Status: DC | PRN
  Administered 2014-09-09: 150 mg via INTRAVENOUS

## 2014-09-09 MED ORDER — AMOXICILLIN 875 MG PO TABS: 875.0000 mg | ORAL_TABLET | Freq: Two times a day (BID) | ORAL | Status: DC

## 2014-09-09 MED ORDER — OXYCODONE-ACETAMINOPHEN 5-325 MG PO TABS
1.0000 | ORAL_TABLET | ORAL | Status: DC | PRN
Start: 2014-09-09 — End: 2015-04-24

## 2014-09-09 MED ORDER — SUCCINYLCHOLINE CHLORIDE 20 MG/ML IJ SOLN
INTRAMUSCULAR | Status: DC | PRN
  Administered 2014-09-09: 100 mg via INTRAVENOUS

## 2014-09-09 SURGICAL SUPPLY — 47 items
BLADE ROTATE RAD 12 4 M4 (BLADE) IMPLANT
BLADE ROTATE RAD 40 4 M4 (BLADE) IMPLANT
BLADE ROTATE TRICUT 4X13 M4 (BLADE) ×2 IMPLANT
BLADE TRICUT ROTATE M4 4 5PK (BLADE) IMPLANT
BUR HS RAD FRONTAL 3 (BURR) IMPLANT
CANISTER SUC SOCK COL 7IN (MISCELLANEOUS) ×4 IMPLANT
CANISTER SUCT 1200ML W/VALVE (MISCELLANEOUS) ×2 IMPLANT
COAGULATOR SUCT 8FR VV (MISCELLANEOUS) ×1 IMPLANT
DECANTER SPIKE VIAL GLASS SM (MISCELLANEOUS) IMPLANT
DRSG NASAL KENNEDY LMNT 8CM (GAUZE/BANDAGES/DRESSINGS) IMPLANT
DRSG NASOPORE 8CM (GAUZE/BANDAGES/DRESSINGS) ×2 IMPLANT
DRSG TELFA 3X8 NADH (GAUZE/BANDAGES/DRESSINGS) IMPLANT
ELECT REM PT RETURN 9FT ADLT (ELECTROSURGICAL) ×2
ELECTRODE REM PT RTRN 9FT ADLT (ELECTROSURGICAL) IMPLANT
GLOVE BIO SURGEON STRL SZ7.5 (GLOVE) ×2 IMPLANT
GLOVE SURG SS PI 7.0 STRL IVOR (GLOVE) ×1 IMPLANT
GOWN STRL REUS W/ TWL LRG LVL3 (GOWN DISPOSABLE) ×2 IMPLANT
GOWN STRL REUS W/TWL LRG LVL3 (GOWN DISPOSABLE) ×4
HEMOSTAT SURGICEL 2X14 (HEMOSTASIS) IMPLANT
IV NS 1000ML (IV SOLUTION)
IV NS 1000ML BAXH (IV SOLUTION) IMPLANT
IV NS 500ML (IV SOLUTION) ×2
IV NS 500ML BAXH (IV SOLUTION) ×1 IMPLANT
NDL HYPO 25X1 1.5 SAFETY (NEEDLE) ×1 IMPLANT
NDL SPNL 25GX3.5 QUINCKE BL (NEEDLE) IMPLANT
NEEDLE HYPO 25X1 1.5 SAFETY (NEEDLE) ×2 IMPLANT
NEEDLE SPNL 25GX3.5 QUINCKE BL (NEEDLE) IMPLANT
NS IRRIG 1000ML POUR BTL (IV SOLUTION) ×1 IMPLANT
PACK BASIN DAY SURGERY FS (CUSTOM PROCEDURE TRAY) ×2 IMPLANT
PACK ENT DAY SURGERY (CUSTOM PROCEDURE TRAY) ×2 IMPLANT
PAD DRESSING TELFA 3X8 NADH (GAUZE/BANDAGES/DRESSINGS) IMPLANT
PAD ENT ADHESIVE 25PK (MISCELLANEOUS) ×2 IMPLANT
SLEEVE SCD COMPRESS KNEE MED (MISCELLANEOUS) ×1 IMPLANT
SOLUTION BUTLER CLEAR DIP (MISCELLANEOUS) ×3 IMPLANT
SPLINT NASAL AIRWAY SILICONE (MISCELLANEOUS) IMPLANT
SPONGE GAUZE 2X2 8PLY STRL LF (GAUZE/BANDAGES/DRESSINGS) ×2 IMPLANT
SPONGE NEURO XRAY DETECT 1X3 (DISPOSABLE) ×2 IMPLANT
SUT ETHILON 3 0 PS 1 (SUTURE) IMPLANT
SUT PLAIN 4 0 ~~LOC~~ 1 (SUTURE) IMPLANT
SYR 3ML 23GX1 SAFETY (SYRINGE) IMPLANT
TOWEL OR 17X24 6PK STRL BLUE (TOWEL DISPOSABLE) ×2 IMPLANT
TRACKER ENT INSTRUMENT (MISCELLANEOUS) ×2 IMPLANT
TRACKER ENT PATIENT (MISCELLANEOUS) ×2 IMPLANT
TUBE CONNECTING 20X1/4 (TUBING) ×2 IMPLANT
TUBE SALEM SUMP 16 FR W/ARV (TUBING) IMPLANT
TUBING STRAIGHTSHOT EPS 5PK (TUBING) ×2 IMPLANT
YANKAUER SUCT BULB TIP NO VENT (SUCTIONS) ×2 IMPLANT

## 2014-09-09 NOTE — Transfer of Care (Signed)
Immediate Anesthesia Transfer of Care Note  Patient: Mary Humphrey  Procedure(s) Performed: Procedure(s): BILATERAL ENDOSCOPIC TOTAL ETHMOIDECTOMY/BILATERAL ENDOSCOPIC MAXILLARY ANTROSTOMY/BILATERAL ENDOSCOPIC FRONTAL RECESS EXPLORATION/BILATERAL ENDOSCOPIC SPHENOIDECTOMY (Bilateral)  Patient Location: PACU  Anesthesia Type:General  Level of Consciousness: awake and sedated  Airway & Oxygen Therapy: Patient Spontanous Breathing and Patient connected to face mask oxygen  Post-op Assessment: Report given to PACU RN and Post -op Vital signs reviewed and stable  Post vital signs: Reviewed and stable  Complications: No apparent anesthesia complications

## 2014-09-09 NOTE — Anesthesia Preprocedure Evaluation (Signed)
Anesthesia Evaluation  Patient identified by MRN, date of birth, ID band Patient awake    Reviewed: Allergy & Precautions, NPO status , Patient's Chart, lab work & pertinent test results  Airway Mallampati: I  TM Distance: >3 FB Neck ROM: Full    Dental   Pulmonary          Cardiovascular     Neuro/Psych    GI/Hepatic   Endo/Other    Renal/GU      Musculoskeletal   Abdominal   Peds  Hematology   Anesthesia Other Findings   Reproductive/Obstetrics                             Anesthesia Physical Anesthesia Plan  ASA: II  Anesthesia Plan: General   Post-op Pain Management:    Induction: Intravenous  Airway Management Planned: Oral ETT  Additional Equipment:   Intra-op Plan:   Post-operative Plan: Extubation in OR  Informed Consent: I have reviewed the patients History and Physical, chart, labs and discussed the procedure including the risks, benefits and alternatives for the proposed anesthesia with the patient or authorized representative who has indicated his/her understanding and acceptance.     Plan Discussed with: CRNA and Surgeon  Anesthesia Plan Comments:         Anesthesia Quick Evaluation

## 2014-09-09 NOTE — Discharge Instructions (Addendum)

## 2014-09-09 NOTE — H&P (Signed)
Cc: Bilateral chronic pan-rhinosinusitis, polyposis  HPI: The patient is a 59 year old female who returns today for her follow-up evaluation.  She was last seen 2 weeks ago.  At that time, she was noted to have multiple large polyps within her nasal cavities bilaterally.  The patient has been experiencing bilateral chronic nasal obstruction for several months.  She was treated with weekly allergy shots, Flonase Nasal Spray, Astelin Nasal Spray, Zyrtec, and Allegra.  However, she continued to be symptomatic.  She subsequently underwent a paranasal sinus CT scan.  The CT showed bilateral pansinusitis, with opacification of all paranasal sinuses.  She was also treated with multiple courses of prednisone. She continues to be symptomatic. She is interested in more definitive treatment of her chronic nasal obstruction.    Exam: The nasal cavities were decongested and anesthetised with a combination of oxymetazoline and 4% lidocaine solution.  The flexible scope was inserted into the right nasal cavity.  Endoscopy of the inferior and middle meatus was performed.  The edematous mucosa was noted.  Multiple large polyps are noted within the nasal cavity, obstructing the middle meatus. Nasopharynx was clear.  Turbinates were hypertrophied but without mass.  The procedure was repeated on the contralateral side with similar findings.  The patient tolerated the procedure well.  Instructions were given to avoid eating or drinking for 2 hours.  Assessment: 1.  Bilateral severe sinonasal polyposis, involving all paranasal sinuses.  The patient continues to have large polyps obstructing her nasal cavities bilaterally.   2.  The patient's polyposis has not responded to her allergy treatment regimen and multiple systemic steroids.   Plan: 1.  The nasal endoscopy findings and CT images are extensively reviewed with the patient.   2.  Based on the above findings, the patient will likely benefit from undergoing bilateral  endoscopic sinus surgery to remove all her sinonasal polyps. This will involve bilateral maxillary antrostomies, total ethmoidectomy, sphenoidectomy and frontal sinusotomy.   3.  The risks, benefits, alternatives and details of the procedure are reviewed with the patient.  The patient would like to schedule the surgery.

## 2014-09-09 NOTE — Anesthesia Procedure Notes (Signed)
Procedure Name: Intubation Date/Time: 09/09/2014 8:47 AM Performed by: Melynda Ripple D Pre-anesthesia Checklist: Patient identified, Emergency Drugs available, Suction available and Patient being monitored Patient Re-evaluated:Patient Re-evaluated prior to inductionOxygen Delivery Method: Circle System Utilized Preoxygenation: Pre-oxygenation with 100% oxygen Intubation Type: IV induction Ventilation: Mask ventilation without difficulty Laryngoscope Size: Mac and 3 Grade View: Grade I Tube type: Oral Tube size: 7.0 mm Number of attempts: 1 Airway Equipment and Method: Stylet and Oral airway Placement Confirmation: ETT inserted through vocal cords under direct vision,  positive ETCO2 and breath sounds checked- equal and bilateral Secured at: 22 cm Tube secured with: Tape Dental Injury: Teeth and Oropharynx as per pre-operative assessment

## 2014-09-09 NOTE — Brief Op Note (Signed)
09/09/2014  11:14 AM  PATIENT:  Mary Humphrey  59 y.o. female  PRE-OPERATIVE DIAGNOSIS:  Bilateral chronic pansinusitis and polyposis  POST-OPERATIVE DIAGNOSIS:  Bilateral chronic pansinusitis and polyposis  PROCEDURE:  Procedure(s): 1. Bilateral endoscopic frontal sinusotomy with polyp removal. 2. Bilateral endoscopic total ethmoidectomy. 3. Bilateral endoscopic total maxillary antrostomy with polyp removal. 4. Bilateral endoscopic sphenoidotomy with polyp removal. 5. FUSION stereotactic image guidance system  SURGEON:  Surgeon(s) and Role:    * Ascencion Dike, MD - Primary  PHYSICIAN ASSISTANT:   ASSISTANTS: none   ANESTHESIA:   general  EBL:  Total I/O In: 2400 [I.V.:2400] Out: -   BLOOD ADMINISTERED:none  DRAINS: none   LOCAL MEDICATIONS USED:  NONE  SPECIMEN:  Source of Specimen:  Bilateral sinus contents  DISPOSITION OF SPECIMEN:  PATHOLOGY  COUNTS:  YES  TOURNIQUET:  * No tourniquets in log *  DICTATION: .Other Dictation: Dictation Number [id number]  PLAN OF CARE: Discharge to home after PACU  PATIENT DISPOSITION:  PACU - hemodynamically stable.   Delay start of Pharmacological VTE agent (>24hrs) due to surgical blood loss or risk of bleeding: not applicable

## 2014-09-09 NOTE — Anesthesia Postprocedure Evaluation (Signed)
Anesthesia Post Note  Patient: Mary Humphrey  Procedure(s) Performed: Procedure(s) (LRB): BILATERAL ENDOSCOPIC TOTAL ETHMOIDECTOMY/BILATERAL ENDOSCOPIC MAXILLARY ANTROSTOMY/BILATERAL ENDOSCOPIC FRONTAL RECESS EXPLORATION/BILATERAL ENDOSCOPIC SPHENOIDECTOMY (Bilateral)  Anesthesia type: general  Patient location: PACU  Post pain: Pain level controlled  Post assessment: Patient's Cardiovascular Status Stable  Last Vitals:  Filed Vitals:   09/09/14 1311  BP: 130/88  Pulse: 90  Temp: 36.4 C  Resp: 16    Post vital signs: Reviewed and stable  Level of consciousness: sedated  Complications: No apparent anesthesia complications

## 2014-09-10 ENCOUNTER — Encounter (HOSPITAL_BASED_OUTPATIENT_CLINIC_OR_DEPARTMENT_OTHER): Payer: Self-pay | Admitting: Otolaryngology

## 2014-09-10 NOTE — Op Note (Signed)
Mary Humphrey, Mary Humphrey NO.:  [id number]  MEDICAL RECORD NO.:  [id number]  LOCATION:                                FACILITY:  [facility]  PHYSICIAN:  Leta Baptist, MD            DATE OF BIRTH:  [birth year]  DATE OF PROCEDURE:  09/09/2014 DATE OF DISCHARGE:  09/09/2014                              OPERATIVE REPORT   SURGEON:  Leta Baptist, MD  PREOPERATIVE DIAGNOSES: 1. Bilateral chronic pansinusitis. 2. Bilateral sinonasal polyps.  POSTOPERATIVE DIAGNOSES: 1. Bilateral chronic pansinusitis. 2. Bilateral sinonasal polyps.  PROCEDURE PERFORMED: 1. Bilateral endoscopic frontal sinusotomy with polyp removal. 2. Bilateral endoscopic total ethmoidectomy. 3. Bilateral endoscopic maxillary antrostomy with polyp removal. 4. Bilateral endoscopic sphenoidectomy with polyp removal. 5. FUSION stereotactic image guidance.  ANESTHESIA:  General endotracheal tube anesthesia.  COMPLICATIONS:  None.  ESTIMATED BLOOD LOSS:  Less than 200 mL.  INDICATION FOR PROCEDURE:  The patient is a 59 year old female with a history of bilateral chronic nasal obstruction, secondary to bilateral chronic pansinusitis and sinonasal polyps.  On her CT scan, she was noted to have near complete opacification of all paranasal sinuses.  On examination, she was noted to have large nasal polyps bilaterally, nearly completely obstructing her nasal cavities bilaterally.  The patient was previously treated with maximal medical therapy, including weekly allergy shots, Flonase nasal sprays, Astelin nasal sprays, Zyrtec, Allegra, and other allergy medications.  She was also treated with multiple antibiotics.  Despite the treatment, she continues to be symptomatic.  Based on the above findings, the decision was made for the patient to undergo the above-stated procedures.  The risks, benefits, alternatives, and details of the procedures were discussed with the patient.  Questions were invited and answered.  Informed  consent was obtained.  DESCRIPTION OF PROCEDURE:  The patient was taken to the operating room and placed supine on the operating table.  General endotracheal tube anesthesia was administered by the anesthesiologist.  Preop IV antibiotics and Decadron were given.  The patient was positioned and prepped and draped in a standard fashion for the above-stated procedures.  Pledgets soaked with Afrin were placed in both nasal cavities for vasoconstriction.  The FUSION stereotactic image guidance marker was placed.  The image guidance system was functional throughout the case.  The nasal pledgets was removed.  Attention was first focused on the left side.  Endoscopic examination of the left nasal cavity revealed a large nasal polyps, obstructing the entire nasal cavity.  The polypoid tissue was removed in a piecemeal fashion using a combination of Blakesley forceps, Tru-Cut forceps, and microdebrider.  The middle turbinate was then carefully medialized.  A large amount of polypoid tissue was also removed from the middle meatus.  The maxillary antrum was then enlarged using a combination of backbiters, Tru-Cut forceps, and microdebrider. A large amount of polypoid tissue and mucoid fluid were removed from the maxillary sinus.  The anterior and posterior ethmoid cavities were then opened.  A large amount of polypoid tissue was also removed from the ethmoid cavities.  The sphenoid opening was then enlarged using the suction catheter and the microdebrider.  The entire sphenoid  sinus was filled with polypoid tissue.  The polypoid tissue was removed. Attention was then focused on the frontal sinus.  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  However, her frontal recess was noted to be occupied with polypoid tissue.  The polypoid tissue was removed.  The frontal recess was carefully enlarged.  Saline irrigation was then used to irrigate all the sinuses.  Hemostasis was achieved  by placing nasal packing.  The same procedure was then repeated on the right side without exception.  The patient was again noted to have polypoid tissue obstructing the entire maxillary, ethmoid, frontal recess, and the sphenoid cavities.  The care of the patient was turned over to the anesthesiologist.  The patient was awakened from anesthesia without difficulty.  She was extubated and transferred to the recovery room in good condition.  OPERATIVE FINDINGS:  Sinonasal polyps were noted to obstruct both nasal cavities and/or paranasal sinuses.  The mucoid drainage was also suctioned from the paranasal sinuses.  SPECIMEN:  Bilateral sinus contents.  FOLLOWUP CARE:  The patient will be discharged home when she is awake and alert.  She will be placed on Percocet p.r.n. pain, and amoxicillin p.o. b.i.d. for 5 days.  The patient will follow up in my office in 1 week.     Leta Baptist, MD     ST/MEDQ  D:  09/09/2014  T:  09/10/2014  Job:  [id number]  cc:   Melvern Sample, M.D.

## 2014-09-13 ENCOUNTER — Encounter (HOSPITAL_BASED_OUTPATIENT_CLINIC_OR_DEPARTMENT_OTHER): Payer: Self-pay | Admitting: Otolaryngology

## 2015-01-27 ENCOUNTER — Encounter: Payer: Self-pay | Admitting: *Deleted

## 2015-01-31 ENCOUNTER — Other Ambulatory Visit: Payer: Self-pay

## 2015-01-31 DIAGNOSIS — Z1231 Encounter for screening mammogram for malignant neoplasm of breast: Secondary | ICD-10-CM

## 2015-02-11 ENCOUNTER — Ambulatory Visit
Admission: RE | Admit: 2015-02-11 | Discharge: 2015-02-11 | Disposition: A | Discharge status: Home / Self Care | Payer: BC Managed Care – PPO | Source: Ambulatory Visit

## 2015-02-11 DIAGNOSIS — Z1231 Encounter for screening mammogram for malignant neoplasm of breast: Secondary | ICD-10-CM

## 2015-03-19 ENCOUNTER — Telehealth: Payer: Self-pay

## 2015-03-19 DIAGNOSIS — N6459 Other signs and symptoms in breast: Secondary | ICD-10-CM

## 2015-03-19 NOTE — Telephone Encounter (Signed)
[place] and diag mammo referral put in

## 2015-03-19 NOTE — Telephone Encounter (Signed)
Do we agree? Can we place order.

## 2015-03-19 NOTE — Telephone Encounter (Signed)
Pt hasn't been here since 06/2014. Will need to RTC

## 2015-03-19 NOTE — Telephone Encounter (Signed)
Pt was scheduled with [facility] for a mammogram but when she came for visit she complained of a problem of the right breast -[facility] called today to get an order for a diagnostic tomo bilater mammorgram and and right breast ultrasound for this patient to be rescheduled

## 2015-03-20 NOTE — Telephone Encounter (Signed)
Left message letting pt know. 

## 2015-03-27 ENCOUNTER — Ambulatory Visit
Admission: RE | Admit: 2015-03-27 | Discharge: 2015-03-27 | Disposition: A | Discharge status: Home / Self Care | Payer: BC Managed Care – PPO | Source: Ambulatory Visit | Attending: Physician Assistant | Admitting: Physician Assistant

## 2015-03-27 DIAGNOSIS — N6459 Other signs and symptoms in breast: Secondary | ICD-10-CM

## 2015-04-24 ENCOUNTER — Ambulatory Visit (INDEPENDENT_AMBULATORY_CARE_PROVIDER_SITE_OTHER): Payer: BC Managed Care – PPO

## 2015-04-24 ENCOUNTER — Ambulatory Visit (INDEPENDENT_AMBULATORY_CARE_PROVIDER_SITE_OTHER): Payer: BC Managed Care – PPO | Admitting: Physician Assistant

## 2015-04-24 VITALS — BP 110/80 | HR 124 | Temp 99.8°F | Resp 18 | Ht 66.25 in | Wt 165.0 lb

## 2015-04-24 DIAGNOSIS — J189 Pneumonia, unspecified organism: Secondary | ICD-10-CM

## 2015-04-24 DIAGNOSIS — R509 Fever, unspecified: Secondary | ICD-10-CM

## 2015-04-24 LAB — POCT CBC
Granulocyte percent: 90 %G — AB (ref 37–80)
HCT, POC: 41.8 % (ref 37.7–47.9)
Hemoglobin: 13.3 g/dL (ref 12.2–16.2)
Lymph, poc: 1.5 (ref 0.6–3.4)
MCH, POC: 28.4 pg (ref 27–31.2)
MCHC: 31.8 g/dL (ref 31.8–35.4)
MCV: 89.4 fL (ref 80–97)
MID (cbc): 0.6 (ref 0–0.9)
MPV: 8.3 fL (ref 0–99.8)
POC Granulocyte: 18.5 — AB (ref 2–6.9)
POC LYMPH PERCENT: 7.2 %L — AB (ref 10–50)
POC MID %: 2.8 %M (ref 0–12)
Platelet Count, POC: 222 10*3/uL (ref 142–424)
RBC: 4.68 M/uL (ref 4.04–5.48)
RDW, POC: 13.7 %
WBC: 20.6 10*3/uL — AB (ref 4.6–10.2)

## 2015-04-24 MED ORDER — AZITHROMYCIN 250 MG PO TABS: ORAL_TABLET | ORAL | Status: DC

## 2015-04-24 MED ORDER — GUAIFENESIN ER 1200 MG PO TB12: 1.0000 | ORAL_TABLET | Freq: Two times a day (BID) | ORAL | Status: DC | PRN

## 2015-04-24 MED ORDER — CEFTRIAXONE SODIUM 1 G IJ SOLR
1.0000 g | Freq: Once | INTRAMUSCULAR | Status: AC
  Administered 2015-04-24: 1 g via INTRAMUSCULAR

## 2015-04-24 NOTE — Patient Instructions (Signed)
-Please hydrate 64 oz per day with almost 4 regular sized water bottles. -Take the mucinex as prescribed.     Pneumonia Pneumonia is an infection of the lungs.  CAUSES Pneumonia may be caused by bacteria or a virus. Usually, these infections are caused by breathing infectious particles into the lungs (respiratory tract). SIGNS AND SYMPTOMS   Cough.  Fever.  Chest pain.  Increased rate of breathing.  Wheezing.  Mucus production. DIAGNOSIS  If you have the common symptoms of pneumonia, your health care provider will typically confirm the diagnosis with a chest X-ray. The X-ray will show an abnormality in the lung (pulmonary infiltrate) if you have pneumonia. Other tests of your blood, urine, or sputum may be done to find the specific cause of your pneumonia. Your health care provider may also do tests (blood gases or pulse oximetry) to see how well your lungs are working. TREATMENT  Some forms of pneumonia may be spread to other people when you cough or sneeze. You may be asked to wear a mask before and during your exam. Pneumonia that is caused by bacteria is treated with antibiotic medicine. Pneumonia that is caused by the influenza virus may be treated with an antiviral medicine. Most other viral infections must run their course. These infections will not respond to antibiotics.  HOME CARE INSTRUCTIONS   Cough suppressants may be used if you are losing too much rest. However, coughing protects you by clearing your lungs. You should avoid using cough suppressants if you can.  Your health care provider may have prescribed medicine if he or she thinks your pneumonia is caused by bacteria or influenza. Finish your medicine even if you start to feel better.  Your health care provider may also prescribe an expectorant. This loosens the mucus to be coughed up.  Take medicines only as directed by your health care provider.  Do not smoke. Smoking is a common cause of bronchitis and can  contribute to pneumonia. If you are a smoker and continue to smoke, your cough may last several weeks after your pneumonia has cleared.  A cold steam vaporizer or humidifier in your room or home may help loosen mucus.  Coughing is often worse at night. Sleeping in a semi-upright position in a recliner or using a couple pillows under your head will help with this.  Get rest as you feel it is needed. Your body will usually let you know when you need to rest. PREVENTION A pneumococcal shot (vaccine) is available to prevent a common bacterial cause of pneumonia. This is usually suggested for:  People over 38 years old.  Patients on chemotherapy.  People with chronic lung problems, such as bronchitis or emphysema.  People with immune system problems. If you are over 65 or have a high risk condition, you may receive the pneumococcal vaccine if you have not received it before. In some countries, a routine influenza vaccine is also recommended. This vaccine can help prevent some cases of pneumonia.You may be offered the influenza vaccine as part of your care. If you smoke, it is time to quit. You may receive instructions on how to stop smoking. Your health care provider can provide medicines and counseling to help you quit. SEEK MEDICAL CARE IF: You have a fever. SEEK IMMEDIATE MEDICAL CARE IF:   Your illness becomes worse. This is especially true if you are elderly or weakened from any other disease.  You cannot control your cough with suppressants and are losing sleep.  You  begin coughing up blood.  You develop pain which is getting worse or is uncontrolled with medicines.  Any of the symptoms which initially brought you in for treatment are getting worse rather than better.  You develop shortness of breath or chest pain. MAKE SURE YOU:   Understand these instructions.  Will watch your condition.  Will get help right away if you are not doing well or get worse. Document Released:  08/02/2005 Document Revised: 12/17/2013 Document Reviewed: 10/22/2010 [facility] Patient Information 2015 [place], [place]. This information is not intended to replace advice given to you by your health care provider. Make sure you discuss any questions you have with your health care provider.

## 2015-04-24 NOTE — Progress Notes (Deleted)
[facility] [address] [place] [address] [phone]  Date:  04/24/2015   Name:  Mary Humphrey   DOB:  [birth year]   MRN:  [id number]  PCP:  Delman Cheadle, MD    History of Present Illness: Mary Humphrey is a 59 y.o. female patient who presents to [facility] for chief complaint of headache, left sided back pain and fever for 2 days.   Patient started with fatigue and headache.  She went to bed with tylenol, and the next morning, she was feeling worse with fever and chills.  She has pain at her left upper back.  She has noticed she is coughing more, though that is normal for her due to her allergies.  She denies, dysuria, frequency, n/v, or abdominal pain. Non-productive cough or calf pain.  She denies dyspnea, though has pain at left upper back with inspiration.   Patient Active Problem List   Diagnosis Date Noted  . History of colonic polyps 07/09/2014  . 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 Asthma 08/18/2012  . Allergic rhinitis 08/18/2012    Past Medical History  Diagnosis Date  . Allergy   . Asthma   . Sinusitis   . Environmental and seasonal allergies     Past Surgical History  Procedure Laterality Date  . Sinus endo w/fusion Bilateral 09/09/2014    Procedure: BILATERAL ENDOSCOPIC TOTAL ETHMOIDECTOMY/BILATERAL ENDOSCOPIC MAXILLARY ANTROSTOMY/BILATERAL ENDOSCOPIC FRONTAL RECESS EXPLORATION/BILATERAL ENDOSCOPIC SPHENOIDECTOMY WITH FUSION NAVIGATION;  Surgeon: Ascencion Dike, MD;  Location: [facility];  Service: ENT;  Laterality: Bilateral;    Social History  Substance Use Topics  . Smoking status: Never Smoker   . Smokeless tobacco: Never Used  . Alcohol Use: 0.0 oz/week    0 Standard drinks or equivalent per week     Comment: 2-3 drinks-socially    Family History  Problem Relation Age of Onset  . Asthma Sister   . Heart disease Father   . Colon cancer Neg Hx   . Rectal cancer Neg Hx   . Stomach cancer Neg Hx     Allergies  Allergen  Reactions  . Doxycycline Other (See Comments)    Gi UPSET-DIARRHEAQ    Medication list has been reviewed and updated.  Current Outpatient Prescriptions on File Prior to Visit  Medication Sig Dispense Refill  . albuterol (PROVENTIL HFA;VENTOLIN HFA) 108 (90 BASE) MCG/ACT inhaler Inhale into the lungs every 6 (six) hours as needed for wheezing or shortness of breath.    Marland Kitchen azelastine (ASTELIN) 0.1 % nasal spray Place into both nostrils 2 (two) times daily. Use in each nostril as directed    . cetirizine (ZYRTEC) 10 MG tablet Take 10 mg by mouth daily.    Marland Kitchen EPINEPHrine 0.3 mg/0.3 mL IJ SOAJ injection Inject into the muscle once.    . Montelukast Sodium (SINGULAIR PO) Take by mouth.    Marland Kitchen UNABLE TO FIND Med Name: ***allergy shots weekly    . Fluticasone-Salmeterol (ADVAIR) 100-50 MCG/DOSE AEPB Inhale 1 puff into the lungs every 12 (twelve) hours. (Patient not taking: Reported on 04/24/2015) 60 each 11   No current facility-administered medications on file prior to visit.    ROS  ROS otherwise unremarkable unless listed above.   Physical Examination: BP 110/80 mmHg  Pulse 124  Temp(Src) 99.8 F (37.7 C) (Oral)  Resp 18  Ht 5' 6.25" (1.683 m)  Wt 165 lb (74.844 kg)  BMI 26.42 kg/m2  SpO2 97% Ideal Body Weight: Weight in (lb) to  have BMI = 25: 155.7  Physical Exam  Constitutional: She is oriented to person, place, and time. She appears well-developed and well-nourished. No distress.  HENT:  Head: Normocephalic and atraumatic.  Eyes: Conjunctivae and EOM are normal. Pupils are equal, round, and reactive to light.  Cardiovascular: Normal rate and regular rhythm.  Exam reveals no friction rub.   No murmur heard. Pulmonary/Chest: Effort normal and breath sounds normal. No respiratory distress. She has no wheezes.  Musculoskeletal:  Tender at right mid-thoracic upon palpation.  Neurological: She is alert and oriented to person, place, and time.  Skin: Skin is warm and dry. She is not  diaphoretic.  Psychiatric: She has a normal mood and affect. Her behavior is normal.   Results for orders placed or performed in visit on 04/24/15  POCT CBC  Result Value Ref Range   WBC 20.6 (A) 4.6 - 10.2 K/uL   Lymph, poc 1.5 0.6 - 3.4   POC LYMPH PERCENT 7.2 (A) 10 - 50 %L   MID (cbc) 0.6 0 - 0.9   POC MID % 2.8 0 - 12 %M   POC Granulocyte 18.5 (A) 2 - 6.9   Granulocyte percent 90.0 (A) 37 - 80 %G   RBC 4.68 4.04 - 5.48 M/uL   Hemoglobin 13.3 12.2 - 16.2 g/dL   HCT, POC 41.8 37.7 - 47.9 %   MCV 89.4 80 - 97 fL   MCH, POC 28.4 27 - 31.2 pg   MCHC 31.8 31.8 - 35.4 g/dL   RDW, POC 13.7 %   Platelet Count, POC 222 142 - 424 K/uL   MPV 8.3 0 - 99.8 fL    [facility] reading (PRIMARY) by  Dr. Joseph Art: Loss of silhouette.  Left hemidiaphragm with blunt costal vertebral angle consistent with pneumonia.  Air bronchogram at [facility].  Assessment and Plan: 59 year old female is here today with headache fatigue, left sided back pain, and low grade fever.  XR consistent with pneumonia.   Given 1G rocephin at this time.  Will cover for atypicals.  No hx of tobacco use or co-morbidities.  Given azithromycin.   Advised to rtn to clinic in 2 days for recheck with Dr. Joseph Art. Alarming sxs discussed.   Low grade fever - Plan: POCT CBC, DG Chest 2 View  Pneumonia, organism unspecified - Plan: azithromycin (ZITHROMAX) 250 MG tablet, cefTRIAXone (ROCEPHIN) injection 1 g, DISCONTINUED: Guaifenesin (MUCINEX MAXIMUM STRENGTH) 1200 MG TB12   Ivar Drape, PA-C [facility] 05/02/2015 8:18 PM

## 2015-04-26 ENCOUNTER — Ambulatory Visit (INDEPENDENT_AMBULATORY_CARE_PROVIDER_SITE_OTHER): Payer: BC Managed Care – PPO | Admitting: Family Medicine

## 2015-04-26 VITALS — BP 126/82 | HR 85 | Temp 98.2°F | Resp 16 | Ht 66.25 in | Wt 164.8 lb

## 2015-04-26 DIAGNOSIS — J189 Pneumonia, unspecified organism: Secondary | ICD-10-CM

## 2015-04-26 LAB — POCT CBC
Granulocyte percent: 74.9 %G (ref 37–80)
HCT, POC: 38.6 % (ref 37.7–47.9)
Hemoglobin: 12.5 g/dL (ref 12.2–16.2)
Lymph, poc: 1.7 (ref 0.6–3.4)
MCH, POC: 29.1 pg (ref 27–31.2)
MCHC: 32.4 g/dL (ref 31.8–35.4)
MCV: 89.8 fL (ref 80–97)
MID (cbc): 0.6 (ref 0–0.9)
MPV: 8.1 fL (ref 0–99.8)
POC Granulocyte: 6.6 (ref 2–6.9)
POC LYMPH PERCENT: 18.8 %L (ref 10–50)
POC MID %: 6.3 %M (ref 0–12)
Platelet Count, POC: 218 10*3/uL (ref 142–424)
RBC: 4.3 M/uL (ref 4.04–5.48)
RDW, POC: 13.9 %
WBC: 8.8 10*3/uL (ref 4.6–10.2)

## 2015-04-26 NOTE — Patient Instructions (Signed)
Physical exam, symptoms, and lab tests all suggests that your resolving a community-acquired pneumonia. You are no longer contagious and may resume normal activities as long as you finish your antibiotic.

## 2015-04-26 NOTE — Progress Notes (Signed)
@[email or web address] @  This chart was scribed for Mary Haber, MD by Mary Humphrey, ED Scribe. This patient was seen in room 11 and the patient's care was started at 11:44 AM.  Patient ID: Mary Humphrey MRN: [id number], DOB: 07/25/1956, 59 y.o. Date of Encounter: 04/26/2015, 11:44 AM  Primary Physician: Delman Cheadle, MD  Chief Complaint:  Chief Complaint  Patient presents with   Follow-up    Pneumonia - feel better    HPI: 59 y.o. year old female with history below presents for a left lower lobe pneumonia follow up. Pt was seen 2 days ago for HA, left sided back pain and cough that began 2 days prior from that visit and was diagnosed with pneumonia. She was prescribed zithromax, muxinex and rocephin. Pt states since last visit she feels much better. Since starting the abx she denies left upper back pain and fever. She states mucinex has helped with her cough.  Past Medical History  Diagnosis Date   Allergy    Asthma    Sinusitis    Environmental and seasonal allergies      Home Meds: Prior to Admission medications   Medication Sig Start Date End Date Taking? Authorizing Provider  albuterol (PROVENTIL HFA;VENTOLIN HFA) 108 (90 BASE) MCG/ACT inhaler Inhale into the lungs every 6 (six) hours as needed for wheezing or shortness of breath.   Yes Historical Provider, MD  azelastine (ASTELIN) 0.1 % nasal spray Place into both nostrils 2 (two) times daily. Use in each nostril as directed   Yes Historical Provider, MD  azithromycin (ZITHROMAX) 250 MG tablet Take 2 tabs PO x 1 dose, then 1 tab PO QD x 4 days 04/24/15  Yes Dorian Heckle English, PA  cetirizine (ZYRTEC) 10 MG tablet Take 10 mg by mouth daily.   Yes Historical Provider, MD  EPINEPHrine 0.3 mg/0.3 mL IJ SOAJ injection Inject into the muscle once.   Yes Historical Provider, MD  Montelukast Sodium (SINGULAIR PO) Take by mouth.   Yes Historical Provider, MD  UNABLE TO FIND Med Name: allergy shots weekly   Yes Historical Provider, MD   Fluticasone-Salmeterol (ADVAIR) 100-50 MCG/DOSE AEPB Inhale 1 puff into the lungs every 12 (twelve) hours. Patient not taking: Reported on 04/24/2015 08/18/12   Wardell Honour, MD    Allergies:  Allergies  Allergen Reactions   Doxycycline Other (See Comments)    Gi UPSET-DIARRHEAQ    Social History   Social History   Marital Status: Married    Spouse Name: N/A   Number of Children: N/A   Years of Education: N/A   Occupational History   Administration [employer]   Social History Main Topics   Smoking status: Never Smoker    Smokeless tobacco: Never Used   Alcohol Use: 0.0 oz/week    0 Standard drinks or equivalent per week     Comment: 2-3 drinks-socially   Drug Use: No   Sexual Activity: Yes    Museum/gallery curator: Post-menopausal   Other Topics Concern   Not on file   Social History Narrative   Marital status:  Married x 33 years      Children: two children; no grandchildren      Lives: with husband      Employment:  Surveyor, quantity at [employer]     Tobacco: none      Alcohol:  Socially three times weekly      Exercise: none     Review of Systems: Constitutional: negative for chills, fever, night sweats, weight changes, or  fatigue  HEENT: negative for vision changes, hearing loss, congestion, rhinorrhea, ST, epistaxis, or sinus pressure Cardiovascular: negative for chest pain or palpitations Respiratory: negative for hemoptysis, wheezing, shortness of breath. Abdominal: negative for abdominal pain, nausea, vomiting, diarrhea, or constipation Dermatological: negative for rash Neurologic: negative for headache, dizziness, or syncope All other systems reviewed and are otherwise negative with the exception to those above and in the HPI.   Physical Exam: Blood pressure 126/82, pulse 85, temperature 98.2 F (36.8 C), temperature source Oral, resp. rate 16, height 5' 6.25" (1.683 m), weight 164 lb 12.8 oz (74.753 kg), SpO2 98 %., Body mass index is 26.39  kg/(m^2). General: Well developed, well nourished, in no acute distress. Head: Normocephalic, atraumatic, eyes without discharge, sclera non-icteric, nares are without discharge. Bilateral auditory canals clear, TM's are without perforation, pearly grey and translucent with reflective cone of light bilaterally. Oral cavity moist, posterior pharynx without exudate, erythema, peritonsillar abscess, or post nasal drip.  Neck: Supple. No thyromegaly. Full ROM. No lymphadenopathy. Lungs: Clear bilaterally to auscultation without wheezes or rhonchi. Rales in left lower lobes. Breathing is unlabored. Heart: RRR with S1 S2. No murmurs, rubs, or gallops appreciated. Abdomen: Soft, non-tender, non-distended with normoactive bowel sounds. No hepatomegaly. No rebound/guarding. No obvious abdominal masses. Msk:  Strength and tone normal for age. Extremities/Skin: Warm and dry. No clubbing or cyanosis. No edema. No rashes or suspicious lesions. Neuro: Alert and oriented X 3. Moves all extremities spontaneously. Gait is normal. CNII-XII grossly in tact. Psych:  Responds to questions appropriately with a normal affect.   Labs: Results for orders placed or performed in visit on 04/26/15  POCT CBC  Result Value Ref Range   WBC 8.8 4.6 - 10.2 K/uL   Lymph, poc 1.7 0.6 - 3.4   POC LYMPH PERCENT 18.8 10 - 50 %L   MID (cbc) 0.6 0 - 0.9   POC MID % 6.3 0 - 12 %M   POC Granulocyte 6.6 2 - 6.9   Granulocyte percent 74.9 37 - 80 %G   RBC 4.30 4.04 - 5.48 M/uL   Hemoglobin 12.5 12.2 - 16.2 g/dL   HCT, POC 38.6 37.7 - 47.9 %   MCV 89.8 80 - 97 fL   MCH, POC 29.1 27 - 31.2 pg   MCHC 32.4 31.8 - 35.4 g/dL   RDW, POC 13.9 %   Platelet Count, POC 218 142 - 424 K/uL   MPV 8.1 0 - 99.8 fL      ASSESSMENT AND PLAN:  59 y.o. year old female with CAP This chart was scribed in my presence and reviewed by me personally.    ICD-9-CM ICD-10-CM   1. CAP (community acquired pneumonia) 63 J18.9 POCT CBC    Signed, Mary Haber, MD 04/26/2015 11:44 AM

## 2015-05-02 NOTE — Progress Notes (Deleted)
[facility] [address] [place] [address] [phone]  Date:  04/24/2015   Name:  Mary Humphrey   DOB:  Feb 28, 1956   MRN:  [id number]  PCP:  Delman Cheadle, MD    History of Present Illness: Mary Humphrey is a 59 y.o. female patient who presents to [facility] for chief complaint of headache, left sided back pain and fever for 2 days.   Patient started with fatigue and headache.  She went to bed with tylenol, and the next morning, she was feeling worse with fever and chills.  She has pain at her left upper back.  She has noticed she is coughing more, though that is normal for her due to her allergies.  She denies, dysuria, frequency, n/v, or abdominal pain. Non-productive cough or calf pain.  She denies dyspnea, though has pain at left upper back with inspiration.   Patient Active Problem List   Diagnosis Date Noted  . History of colonic polyps 07/09/2014  . 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 Asthma 08/18/2012  . Allergic rhinitis 08/18/2012    Past Medical History  Diagnosis Date  . Allergy   . Asthma   . Sinusitis   . Environmental and seasonal allergies     Past Surgical History  Procedure Laterality Date  . Sinus endo w/fusion Bilateral 09/09/2014    Procedure: BILATERAL ENDOSCOPIC TOTAL ETHMOIDECTOMY/BILATERAL ENDOSCOPIC MAXILLARY ANTROSTOMY/BILATERAL ENDOSCOPIC FRONTAL RECESS EXPLORATION/BILATERAL ENDOSCOPIC SPHENOIDECTOMY WITH FUSION NAVIGATION;  Surgeon: Ascencion Dike, MD;  Location: [place];  Service: ENT;  Laterality: Bilateral;    Social History  Substance Use Topics  . Smoking status: Never Smoker   . Smokeless tobacco: Never Used  . Alcohol Use: 0.0 oz/week    0 Standard drinks or equivalent per week     Comment: 2-3 drinks-socially    Family History  Problem Relation Age of Onset  . Asthma Sister   . Heart disease Father   . Colon cancer Neg Hx   . Rectal cancer Neg Hx   . Stomach cancer Neg Hx     Allergies  Allergen  Reactions  . Doxycycline Other (See Comments)    Gi UPSET-DIARRHEAQ    Medication list has been reviewed and updated.  Current Outpatient Prescriptions on File Prior to Visit  Medication Sig Dispense Refill  . albuterol (PROVENTIL HFA;VENTOLIN HFA) 108 (90 BASE) MCG/ACT inhaler Inhale into the lungs every 6 (six) hours as needed for wheezing or shortness of breath.    Marland Kitchen azelastine (ASTELIN) 0.1 % nasal spray Place into both nostrils 2 (two) times daily. Use in each nostril as directed    . cetirizine (ZYRTEC) 10 MG tablet Take 10 mg by mouth daily.    Marland Kitchen EPINEPHrine 0.3 mg/0.3 mL IJ SOAJ injection Inject into the muscle once.    . Montelukast Sodium (SINGULAIR PO) Take by mouth.    Marland Kitchen UNABLE TO FIND Med Name: ***allergy shots weekly    . Fluticasone-Salmeterol (ADVAIR) 100-50 MCG/DOSE AEPB Inhale 1 puff into the lungs every 12 (twelve) hours. (Patient not taking: Reported on 04/24/2015) 60 each 11   No current facility-administered medications on file prior to visit.    ROS  ROS otherwise unremarkable unless listed above.   Physical Examination: BP 110/80 mmHg  Pulse 124  Temp(Src) 99.8 F (37.7 C) (Oral)  Resp 18  Ht 5' 6.25" (1.683 m)  Wt 165 lb (74.844 kg)  BMI 26.42 kg/m2  SpO2 97% Ideal Body Weight: Weight in (lb) to  have BMI = 25: 155.7  Physical Exam  Constitutional: She is oriented to person, place, and time. She appears well-developed and well-nourished. No distress.  HENT:  Head: Normocephalic and atraumatic.  Eyes: Conjunctivae and EOM are normal. Pupils are equal, round, and reactive to light.  Cardiovascular: Normal rate and regular Mary.  Exam reveals no friction rub.   No murmur heard. Pulmonary/Chest: Effort normal and breath sounds normal. No respiratory distress. She has no wheezes.  Musculoskeletal:  Tender at right mid-thoracic upon palpation.  Neurological: She is alert and oriented to person, place, and time.  Skin: Skin is warm and dry. She is not  diaphoretic.  Psychiatric: She has a normal mood and affect. Her behavior is normal.   Results for orders placed or performed in visit on 04/24/15  POCT CBC  Result Value Ref Range   WBC 20.6 (A) 4.6 - 10.2 K/uL   Lymph, poc 1.5 0.6 - 3.4   POC LYMPH PERCENT 7.2 (A) 10 - 50 %L   MID (cbc) 0.6 0 - 0.9   POC MID % 2.8 0 - 12 %M   POC Granulocyte 18.5 (A) 2 - 6.9   Granulocyte percent 90.0 (A) 37 - 80 %G   RBC 4.68 4.04 - 5.48 M/uL   Hemoglobin 13.3 12.2 - 16.2 g/dL   HCT, POC 41.8 37.7 - 47.9 %   MCV 89.4 80 - 97 fL   MCH, POC 28.4 27 - 31.2 pg   MCHC 31.8 31.8 - 35.4 g/dL   RDW, POC 13.7 %   Platelet Count, POC 222 142 - 424 K/uL   MPV 8.3 0 - 99.8 fL    [facility] reading (PRIMARY) by  Dr. Joseph Art: Loss of silhouette.  Left hemidiaphragm with blunt costal vertebral angle consistent with pneumonia.  Air bronchogram at [facility].  Assessment and Plan: 59 year old female is here today with headache fatigue, left sided back pain, and low grade fever.  XR consistent with pneumonia.   Given 1G rocephin at this time.  Will cover for atypicals.  No hx of tobacco use or co-morbidities.  Given azithromycin.   Advised to rtn to clinic in 2 days for recheck with Dr. Joseph Art. Alarming sxs discussed.   Pneumonia, organism unspecified - Plan: azithromycin (ZITHROMAX) 250 MG tablet, cefTRIAXone (ROCEPHIN) injection 1 g, DISCONTINUED: Guaifenesin (MUCINEX MAXIMUM STRENGTH) 1200 MG TB12  Low grade fever - Plan: POCT CBC, DG Chest 2 View  Ivar Drape, PA-C [facility] 05/02/2015 8:18 PM

## 2015-05-03 NOTE — Progress Notes (Signed)
[facility] [address] [place] [address] [phone]  Date:  04/24/2015   Name:  Mary Humphrey   DOB:  July 23, 1956   MRN:  [id number]  PCP:  Delman Cheadle, MD    History of Present Illness:  Mary Humphrey is a 59 y.o. female patient who presents to [facility] for chief complaint of headache, left sided back pain and fever for 2 days.  Patient started with fatigue and headache. She went to bed with tylenol, and the next morning, she was feeling worse with fever and chills. She has pain at her left upper back. She has noticed she is coughing more, though that is normal for her due to her allergies. She denies, dysuria, frequency, n/v, or abdominal pain.  Non-productive cough or calf pain. She denies dyspnea, though has pain at left upper back with inspiration.    Patient Active Problem List   Diagnosis Date Noted  . History of colonic polyps 07/09/2014  . 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 Asthma 08/18/2012  . Allergic rhinitis 08/18/2012    Past Medical History  Diagnosis Date  . Allergy   . Asthma   . Sinusitis   . Environmental and seasonal allergies     Past Surgical History  Procedure Laterality Date  . Sinus endo w/fusion Bilateral 09/09/2014    Procedure: BILATERAL ENDOSCOPIC TOTAL ETHMOIDECTOMY/BILATERAL ENDOSCOPIC MAXILLARY ANTROSTOMY/BILATERAL ENDOSCOPIC FRONTAL RECESS EXPLORATION/BILATERAL ENDOSCOPIC SPHENOIDECTOMY WITH FUSION NAVIGATION;  Surgeon: Ascencion Dike, MD;  Location: [place];  Service: ENT;  Laterality: Bilateral;    Social History  Substance Use Topics  . Smoking status: Never Smoker   . Smokeless tobacco: Never Used  . Alcohol Use: 0.0 oz/week    0 Standard drinks or equivalent per week     Comment: 2-3 drinks-socially    Family History  Problem Relation Age of Onset  . Asthma Sister   . Heart disease Father   . Colon cancer Neg Hx   . Rectal cancer Neg Hx   . Stomach cancer Neg Hx     Allergies  Allergen  Reactions  . Doxycycline Other (See Comments)    Gi UPSET-DIARRHEAQ    Medication list has been reviewed and updated.  Current Outpatient Prescriptions on File Prior to Visit  Medication Sig Dispense Refill  . albuterol (PROVENTIL HFA;VENTOLIN HFA) 108 (90 BASE) MCG/ACT inhaler Inhale into the lungs every 6 (six) hours as needed for wheezing or shortness of breath.    Marland Kitchen azelastine (ASTELIN) 0.1 % nasal spray Place into both nostrils 2 (two) times daily. Use in each nostril as directed    . cetirizine (ZYRTEC) 10 MG tablet Take 10 mg by mouth daily.    Marland Kitchen EPINEPHrine 0.3 mg/0.3 mL IJ SOAJ injection Inject into the muscle once.    . Montelukast Sodium (SINGULAIR PO) Take by mouth.    Marland Kitchen UNABLE TO FIND Med Name: allergy shots weekly    . Fluticasone-Salmeterol (ADVAIR) 100-50 MCG/DOSE AEPB Inhale 1 puff into the lungs every 12 (twelve) hours. (Patient not taking: Reported on 04/24/2015) 60 each 11   No current facility-administered medications on file prior to visit.    ROS ROS otherwise unremarkable unless listed above.   Physical Examination: BP 110/80 mmHg  Pulse 124  Temp(Src) 99.8 F (37.7 C) (Oral)  Resp 18  Ht 5' 6.25" (1.683 m)  Wt 165 lb (74.844 kg)  BMI 26.42 kg/m2  SpO2 97% Ideal Body Weight: Weight in (lb) to have BMI = 25:  155.7 Physical Exam Constitutional: She is oriented to person, place, and time. She appears well-developed and well-nourished. No distress.  HENT:  Head: Normocephalic and atraumatic.  Eyes: Conjunctivae and EOM are normal. Pupils are equal, round, and reactive to light.  Cardiovascular: Normal rate and regular rhythm. Exam reveals no friction rub.  No murmur heard.  Pulmonary/Chest: Effort normal and breath sounds normal. No respiratory distress. She has no wheezes.  Musculoskeletal:  Tender at right mid-thoracic upon palpation.  Neurological: She is alert and oriented to person, place, and time.  Skin: Skin is warm and dry. She is not  diaphoretic.  Psychiatric: She has a normal mood and affect. Her behavior is normal.   Results for orders placed or performed in visit on 04/24/15  POCT CBC  Result Value Ref Range   WBC 20.6 (A) 4.6 - 10.2 K/uL   Lymph, poc 1.5 0.6 - 3.4   POC LYMPH PERCENT 7.2 (A) 10 - 50 %L   MID (cbc) 0.6 0 - 0.9   POC MID % 2.8 0 - 12 %M   POC Granulocyte 18.5 (A) 2 - 6.9   Granulocyte percent 90.0 (A) 37 - 80 %G   RBC 4.68 4.04 - 5.48 M/uL   Hemoglobin 13.3 12.2 - 16.2 g/dL   HCT, POC 41.8 37.7 - 47.9 %   MCV 89.4 80 - 97 fL   MCH, POC 28.4 27 - 31.2 pg   MCHC 31.8 31.8 - 35.4 g/dL   RDW, POC 13.7 %   Platelet Count, POC 222 142 - 424 K/uL   MPV 8.3 0 - 99.8 fL   [facility] reading (PRIMARY) by Dr. Joseph Art: Loss of silhouette. Left hemidiaphragm with blunt costal vertebral angle consistent with pneumonia. Air bronchogram at [facility].  Assessment and Plan: 59 year old female is here today with headache fatigue, left sided back pain, and low grade fever. XR consistent with pneumonia.  Given 1G rocephin at this time. Will cover for atypicals. No hx of tobacco use or co-morbidities. Given azithromycin.  Advised to rtn to clinic in 2 days for recheck with Dr. Joseph Art.  Alarming sxs discussed.   Pneumonia, organism unspecified - Plan: azithromycin (ZITHROMAX) 250 MG tablet, cefTRIAXone (ROCEPHIN) injection 1 g, DISCONTINUED: Guaifenesin (MUCINEX MAXIMUM STRENGTH) 1200 MG TB12   Low grade fever - Plan: POCT CBC, DG Chest 2 View   Ivar Drape, PA-C  [facility]  05/02/2015 8:18 PM

## 2016-05-05 ENCOUNTER — Ambulatory Visit (INDEPENDENT_AMBULATORY_CARE_PROVIDER_SITE_OTHER): Payer: BC Managed Care – PPO

## 2016-05-05 ENCOUNTER — Ambulatory Visit (INDEPENDENT_AMBULATORY_CARE_PROVIDER_SITE_OTHER): Payer: BC Managed Care – PPO | Admitting: Family Medicine

## 2016-05-05 VITALS — BP 116/76 | HR 87 | Temp 98.0°F | Resp 17 | Ht 66.25 in | Wt 162.0 lb

## 2016-05-05 DIAGNOSIS — R059 Cough, unspecified: Secondary | ICD-10-CM

## 2016-05-05 DIAGNOSIS — R05 Cough: Secondary | ICD-10-CM

## 2016-05-05 MED ORDER — HYDROCODONE-HOMATROPINE 5-1.5 MG/5ML PO SYRP: 5.0000 mL | ORAL_SOLUTION | Freq: Three times a day (TID) | ORAL | 0 refills | Status: DC | PRN

## 2016-05-05 MED ORDER — PREDNISONE 20 MG PO TABS: 20.0000 mg | ORAL_TABLET | Freq: Every day | ORAL | 0 refills | Status: DC

## 2016-05-05 NOTE — Progress Notes (Signed)
   Mary Humphrey is a 60 y.o. female who [facility] today for Cough  1.  Cough:  Present x around 2 weeks.  Had fever and URI symptoms about 2 weeks ago, lasted for a few days, resolved. However her cough has persisted.  Descirbes as "mostly dry."  No trouble sleeping at night.  Has history of URI which developed into PNA about a year ago.  Does have diagnosis of daily persistent asthma for years.  Treated with Advair and as needed albuterol.    No fevers or chills.  NO N/V.  No further URI symptoms since initial.   ROS as above.    PMH reviewed. Patient is a nonsmoker.   Past Medical History:  Diagnosis Date  . Allergy   . Asthma   . Environmental and seasonal allergies   . Sinusitis    Past Surgical History:  Procedure Laterality Date  . SINUS ENDO W/FUSION Bilateral 09/09/2014   Procedure: BILATERAL ENDOSCOPIC TOTAL ETHMOIDECTOMY/BILATERAL ENDOSCOPIC MAXILLARY ANTROSTOMY/BILATERAL ENDOSCOPIC FRONTAL RECESS EXPLORATION/BILATERAL ENDOSCOPIC SPHENOIDECTOMY WITH FUSION NAVIGATION;  Surgeon: Ascencion Dike, MD;  Location: [place];  Service: ENT;  Laterality: Bilateral;    Medications reviewed.    Physical Exam:  BP 116/76 (BP Location: Left Arm, Patient Position: Sitting, Cuff Size: Large)   Pulse 87   Temp 98 F (36.7 C) (Oral)   Resp 17   Ht 5' 6.25" (1.683 m)   Wt 162 lb (73.5 kg)   SpO2 96%   BMI 25.95 kg/m  Gen:  Alert, cooperative patient who appears stated age in no acute distress.  Vital signs reviewed. HEENT: EOMI,  MMM Pulm:  Clear to auscultation bilaterally with good air movement.  No wheezes or rales noted.   Cardiac:  Regular rate and rhythm without murmur auscultated.  Good S1/S2. Ext:  No lower extremity edema  Assessment and Plan:  1.  Postviral cough: -She does not have any wheezing or evidence of current asthma exacerbation -I have sent in some prednisone for her in case this continues to persist or  worsen. -She herself does not think she'll need this currently. She is planning on traveling to [place] in a week and would like to ensure that it will not worsen to bronchitis or trigger an asthma exacerbation. As of right now she does not have one. -I have provided her with some Hycodan cough syrup to help with coughing at night and with sleep. Warning precautions about drowsiness provided.

## 2016-05-05 NOTE — Patient Instructions (Addendum)
Your chest xray looked good.  This looks to be just a post-viral cough that should resolve in the next few days.    You should take the Prednisone for relief.  This will be 2 tabs daily for the next 5 days.  You can use the hycodan as a cough syrup if you need it.   This should help you feel better within the week or so.    It was good to see you today!    IF you received an x-ray today, you will receive an invoice from [facility]. Please contact [facility] at [phone] with questions or concerns regarding your invoice.   IF you received labwork today, you will receive an invoice from [employer]. Please contact [facility] at [phone] with questions or concerns regarding your invoice.   Our billing staff will not be able to assist you with questions regarding bills from these companies.  You will be contacted with the lab results as soon as they are available. The fastest way to get your results is to activate your My Chart account. Instructions are located on the last page of this paperwork. If you have not heard from [place] regarding the results in 2 weeks, please contact this office.

## 2016-06-02 ENCOUNTER — Other Ambulatory Visit: Payer: Self-pay | Admitting: Physician Assistant

## 2016-06-02 DIAGNOSIS — Z1231 Encounter for screening mammogram for malignant neoplasm of breast: Secondary | ICD-10-CM

## 2016-06-16 ENCOUNTER — Ambulatory Visit: Payer: BC Managed Care – PPO

## 2016-06-21 ENCOUNTER — Ambulatory Visit (INDEPENDENT_AMBULATORY_CARE_PROVIDER_SITE_OTHER): Payer: BC Managed Care – PPO | Admitting: Physician Assistant

## 2016-06-21 VITALS — BP 128/86 | HR 72 | Temp 98.5°F | Resp 16 | Ht 66.25 in | Wt 162.4 lb

## 2016-06-21 DIAGNOSIS — R21 Rash and other nonspecific skin eruption: Secondary | ICD-10-CM

## 2016-06-21 LAB — POCT SKIN KOH: Skin KOH, POC: NEGATIVE

## 2016-06-21 MED ORDER — TRIAMCINOLONE ACETONIDE 0.5 % EX CREA: 1.0000 "application " | TOPICAL_CREAM | Freq: Two times a day (BID) | CUTANEOUS | 0 refills | Status: AC

## 2016-06-21 NOTE — Progress Notes (Signed)
Mary Humphrey  MRN: [id number] DOB: July 03, 1956  Subjective:  Mary Humphrey is a 60 y.o. female seen in office today for a chief complaint of rash on bilateral hands x 1 month. She was knitting a month ago and noticed this so she thought it was due to the yarn. She then started using plastic gloves but it still continued. It has gotten better over the past few weeks but then just got worse. Has associated burning and minimal pruritis. Pt has only tried using an OTC cortisone cream yesterday with no relief. No changes in laundry detergent, lotions, or soaps. No new antibiotic use. Pt has history of allergies, asthma, and eczema. She has had flare ups of rashes throughout her life but does not recall having anything this extensive on her hands.    Review of Systems  Constitutional: Negative for chills, diaphoresis and fever.    Patient Active Problem List   Diagnosis Date Noted  . History of colonic polyps 07/09/2014  . 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 Asthma 08/18/2012  . Allergic rhinitis 08/18/2012    Current Outpatient Prescriptions on File Prior to Visit  Medication Sig Dispense Refill  . albuterol (PROVENTIL HFA;VENTOLIN HFA) 108 (90 BASE) MCG/ACT inhaler Inhale into the lungs every 6 (six) hours as needed for wheezing or shortness of breath.    Marland Kitchen azelastine (ASTELIN) 0.1 % nasal spray Place into both nostrils 2 (two) times daily. Use in each nostril as directed    . cetirizine (ZYRTEC) 10 MG tablet Take 10 mg by mouth daily.    Marland Kitchen EPINEPHrine 0.3 mg/0.3 mL IJ SOAJ injection Inject into the muscle once.    . Fluticasone-Salmeterol (ADVAIR) 100-50 MCG/DOSE AEPB Inhale 1 puff into the lungs every 12 (twelve) hours. 60 each 11  . Montelukast Sodium (SINGULAIR PO) Take by mouth.    Marland Kitchen UNABLE TO FIND Med Name: allergy shots weekly     No current facility-administered medications on file prior to visit.     Allergies  Allergen Reactions  . Doxycycline Other (See Comments)    Gi  UPSET-DIARRHEAQ     Objective:  BP 128/86   Pulse 72   Temp 98.5 F (36.9 C) (Oral)   Resp 16   Ht 5' 6.25" (1.683 m)   Wt 162 lb 6.4 oz (73.7 kg)   SpO2 98%   BMI 26.01 kg/m   Physical Exam  Constitutional: She is oriented to person, place, and time and well-developed, well-nourished, and in no distress.  HENT:  Head: Normocephalic and atraumatic.  Eyes: Conjunctivae are normal.  Neck: Normal range of motion.  Pulmonary/Chest: Effort normal.  Neurological: She is alert and oriented to person, place, and time. Gait normal.  Skin: Skin is warm and dry.  Scaling, xeroderma, and erythema with interspersed papulovesicular lesions noted on palms of hands bilaterally. Scaling of interdigit web spaces noted on right hand.   Psychiatric: Affect normal.  Vitals reviewed.  Results for orders placed or performed in visit on 06/21/16 (from the past 24 hour(s))  POCT Skin KOH     Status: None   Collection Time: 06/21/16  9:09 AM  Result Value Ref Range   Skin KOH, POC Negative     Assessment and Plan :  1. Rash and nonspecific skin eruption - POCT Skin KOH - triamcinolone cream (KENALOG) 0.5 %; Apply 1 application topically 2 (two) times daily.  Dispense: 30 g; Refill: 0 -Pt instructed to also use emollient such as Aveeno, Cetaphil, or Eucerin cream multiple  times a day  -Return to clinic if symptoms worsen, do not improve, or as needed   Tenna Delaine PA-C  [facility] 06/21/2016 11:15 AM

## 2016-06-21 NOTE — Patient Instructions (Addendum)
Please go buy an emollient such as: Aveeno Moisturizing Cream, Cetaphil Cream, or Eucerin Original (you can also use vaseline) and liberally apply multiple times per day, particularly after hand washing, and after work.  I would like you to use the steroid cream twice daily for two weeks. If no improvement within two weeks, please return to clinic. If symptoms worsen seek care sooner.   IF you received an x-ray today, you will receive an invoice from [facility]. Please contact [facility] at [phone] with questions or concerns regarding your invoice.   IF you received labwork today, you will receive an invoice from [employer]. Please contact [facility] at [phone] with questions or concerns regarding your invoice.   Our billing staff will not be able to assist you with questions regarding bills from these companies.  You will be contacted with the lab results as soon as they are available. The fastest way to get your results is to activate your My Chart account. Instructions are located on the last page of this paperwork. If you have not heard from [place] regarding the results in 2 weeks, please contact this office.

## 2016-06-25 ENCOUNTER — Ambulatory Visit
Admission: RE | Admit: 2016-06-25 | Discharge: 2016-06-25 | Disposition: A | Discharge status: Home / Self Care | Payer: BC Managed Care – PPO | Source: Ambulatory Visit | Attending: Physician Assistant | Admitting: Physician Assistant

## 2016-06-25 DIAGNOSIS — Z1231 Encounter for screening mammogram for malignant neoplasm of breast: Secondary | ICD-10-CM

## 2016-07-08 ENCOUNTER — Emergency Department (HOSPITAL_COMMUNITY)
Admission: EM | Admit: 2016-07-08 | Discharge: 2016-07-08 | Disposition: A | Discharge status: Home / Self Care | Payer: BC Managed Care – PPO | Attending: Emergency Medicine | Admitting: Emergency Medicine

## 2016-07-08 ENCOUNTER — Emergency Department (HOSPITAL_COMMUNITY): Payer: BC Managed Care – PPO

## 2016-07-08 ENCOUNTER — Encounter (HOSPITAL_COMMUNITY): Payer: Self-pay | Admitting: Emergency Medicine

## 2016-07-08 DIAGNOSIS — J45909 Unspecified asthma, uncomplicated: Secondary | ICD-10-CM | POA: Diagnosis not present

## 2016-07-08 DIAGNOSIS — M25561 Pain in right knee: Secondary | ICD-10-CM | POA: Diagnosis not present

## 2016-07-08 MED ORDER — NAPROXEN 500 MG PO TABS: 500.0000 mg | ORAL_TABLET | Freq: Two times a day (BID) | ORAL | 0 refills | Status: DC | PRN

## 2016-07-08 NOTE — Discharge Instructions (Signed)
Naproxen as needed for pain and swelling. Ice and elevate affected area for additional pain relief.  If symptoms do not improve by Monday, you will need to call the orthopedic physician listed to schedule a follow up appointment.  Return to ER for new or worsening symptoms, any additional concerns.

## 2016-07-08 NOTE — ED Provider Notes (Signed)
[facility] DEPT Provider Note   CSN: [id number] Arrival date & time: 07/08/16  0756     History   Chief Complaint Chief Complaint  Patient presents with  . Knee Pain    HPI Mary Humphrey is a 60 y.o. female.  The history is provided by the patient and medical records. No language interpreter was used.  Knee Pain   Pertinent negatives include no numbness.   Mary Humphrey is a 60 y.o. female  with a PMH of asthma and allergies who presents to the Emergency Department complaining of aching right knee pain since yesterday. Patient states she was standing up all day cooking yesterday and at the end of the day, her right knee felt achy. No known injury or fall. She felt as if the knee was twisting oddly "internally" but did not actually twist strange. No hx of knee problems in the past. + swelling. No warmth or erythema. Able to bear weight but painful. Tylenol prior to arrival with adequate pain control.   Past Medical History:  Diagnosis Date  . Allergy   . Asthma   . Environmental and seasonal allergies   . Sinusitis     Patient Active Problem List   Diagnosis Date Noted  . History of colonic polyps 07/09/2014  . 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 Asthma 08/18/2012  . Allergic rhinitis 08/18/2012    Past Surgical History:  Procedure Laterality Date  . SINUS ENDO W/FUSION Bilateral 09/09/2014   Procedure: BILATERAL ENDOSCOPIC TOTAL ETHMOIDECTOMY/BILATERAL ENDOSCOPIC MAXILLARY ANTROSTOMY/BILATERAL ENDOSCOPIC FRONTAL RECESS EXPLORATION/BILATERAL ENDOSCOPIC SPHENOIDECTOMY WITH FUSION NAVIGATION;  Surgeon: Ascencion Dike, MD;  Location: [place];  Service: ENT;  Laterality: Bilateral;    OB History    No data available       Home Medications    Prior to Admission medications   Medication Sig Start Date End Date Taking? Authorizing Provider  albuterol (PROVENTIL HFA;VENTOLIN HFA) 108 (90 BASE) MCG/ACT inhaler Inhale into the lungs every 6 (six) hours as needed  for wheezing or shortness of breath.    Historical Provider, MD  azelastine (ASTELIN) 0.1 % nasal spray Place into both nostrils 2 (two) times daily. Use in each nostril as directed    Historical Provider, MD  cetirizine (ZYRTEC) 10 MG tablet Take 10 mg by mouth daily.    Historical Provider, MD  EPINEPHrine 0.3 mg/0.3 mL IJ SOAJ injection Inject into the muscle once.    Historical Provider, MD  Fluticasone-Salmeterol (ADVAIR) 100-50 MCG/DOSE AEPB Inhale 1 puff into the lungs every 12 (twelve) hours. 08/18/12   Wardell Honour, MD  Montelukast Sodium (SINGULAIR PO) Take by mouth.    Historical Provider, MD  naproxen (NAPROSYN) 500 MG tablet Take 1 tablet (500 mg total) by mouth 2 (two) times daily as needed. 07/08/16   Bruno, PA-C  UNABLE TO FIND Med Name: allergy shots weekly    Historical Provider, MD    Family History Family History  Problem Relation Age of Onset  . Asthma Sister   . Heart disease Father   . Colon cancer Neg Hx   . Rectal cancer Neg Hx   . Stomach cancer Neg Hx     Social History Social History  Substance Use Topics  . Smoking status: Never Smoker  . Smokeless tobacco: Never Used  . Alcohol use 0.0 oz/week     Comment: 2-3 drinks-socially     Allergies   Doxycycline   Review of Systems Review of Systems  Musculoskeletal: Positive  for arthralgias and joint swelling.  Skin: Negative for color change.  Neurological: Negative for weakness and numbness.     Physical Exam Updated Vital Signs BP 145/95 (BP Location: Left Arm)   Pulse 62   Temp 97.8 F (36.6 C) (Oral)   Resp 14   SpO2 98%   Physical Exam  Constitutional: She is oriented to person, place, and time. She appears well-developed and well-nourished. No distress.  HENT:  Head: Normocephalic and atraumatic.  Cardiovascular: Normal rate, regular rhythm and normal heart sounds.   No murmur heard. Pulmonary/Chest: Effort normal and breath sounds normal. No respiratory distress.    Musculoskeletal:  Right knee: No gross deformity noted. TTP of medial joint line with mild associated swelling. Decreased ROM 2/2 pain. No abnormal alignment or patellar mobility. No bruising, erythema, or warmth overlaying the joint. No varus/valgus laxity. Negative drawer's, negative Lachman's. Crepitus with McMurray's. 2+ DP pulses bilaterally. All compartments are soft. Sensation intact distal to injury.  Neurological: She is alert and oriented to person, place, and time.  Skin: Skin is warm and dry.  Nursing note and vitals reviewed.    ED Treatments / Results  Labs (all labs ordered are listed, but only abnormal results are displayed) Labs Reviewed - No data to display  EKG  EKG Interpretation None       Radiology Dg Knee Complete 4 Views Right  Result Date: 07/08/2016 CLINICAL DATA:  Right-sided knee pain and inability to bear weight since last night. No injury. EXAM: RIGHT KNEE - COMPLETE 4+ VIEW COMPARISON:  None. FINDINGS: There is mild decreased bone mineralization. No evidence of fracture or dislocation. No significant joint effusion. No significant degenerative changes. IMPRESSION: Negative. Electronically Signed   By: Marin Olp M.D.   On: 07/08/2016 09:18    Procedures Procedures (including critical care time)  Medications Ordered in ED Medications - No data to display   Initial Impression / Assessment and Plan / ED Course  I have reviewed the triage vital signs and the nursing notes.  Pertinent labs & imaging results that were available during my care of the patient were reviewed by me and considered in my medical decision making (see chart for details).  Clinical Course    Mary Humphrey is a 60 y.o. female who presents to ED for right knee pain which began after standing on feet all day cooking for Thanksgiving. No fall or acute injury. On exam, RLE is NVI and ligaments intact as well. TTP of medial joint line and crepitus appreciated with McMurray's.  X-ray negative. Crutches and knee sleeve provided in ED today. Rx for naproxen given. RICE home care discussed. Ortho follow up recommended and included in discharge. All questions answered.   Final Clinical Impressions(s) / ED Diagnoses   Final diagnoses:  Acute pain of right knee    New Prescriptions New Prescriptions   NAPROXEN (NAPROSYN) 500 MG TABLET    Take 1 tablet (500 mg total) by mouth 2 (two) times daily as needed.     [facility] [place], PA-C 07/08/16 [id number]    Merrily Pew, MD 07/08/16 [phone]

## 2016-07-08 NOTE — Progress Notes (Signed)
Orthopedic Tech Progress Note Patient Details:  Mary Humphrey [birth year] [id number]  Ortho Devices Type of Ortho Device: Knee Sleeve, Crutches Ortho Device/Splint Interventions: Application   Mary Humphrey 07/08/2016, 9:54 AM

## 2016-07-08 NOTE — ED Notes (Signed)
Paged Ortho Tech - will educate pt on how to use crutches.

## 2016-07-08 NOTE — ED Triage Notes (Signed)
Pt here for right knee pain after twisting knee yesterday

## 2016-10-06 IMAGING — CR DG CHEST 2V
2 series · 2 of 2 positions shown · non-contrast
Comparison: Radiograph dated 11/23/2010

CLINICAL DATA: 58-year-old female with low grade femur

EXAM:
CHEST  2 VIEW

[PA]
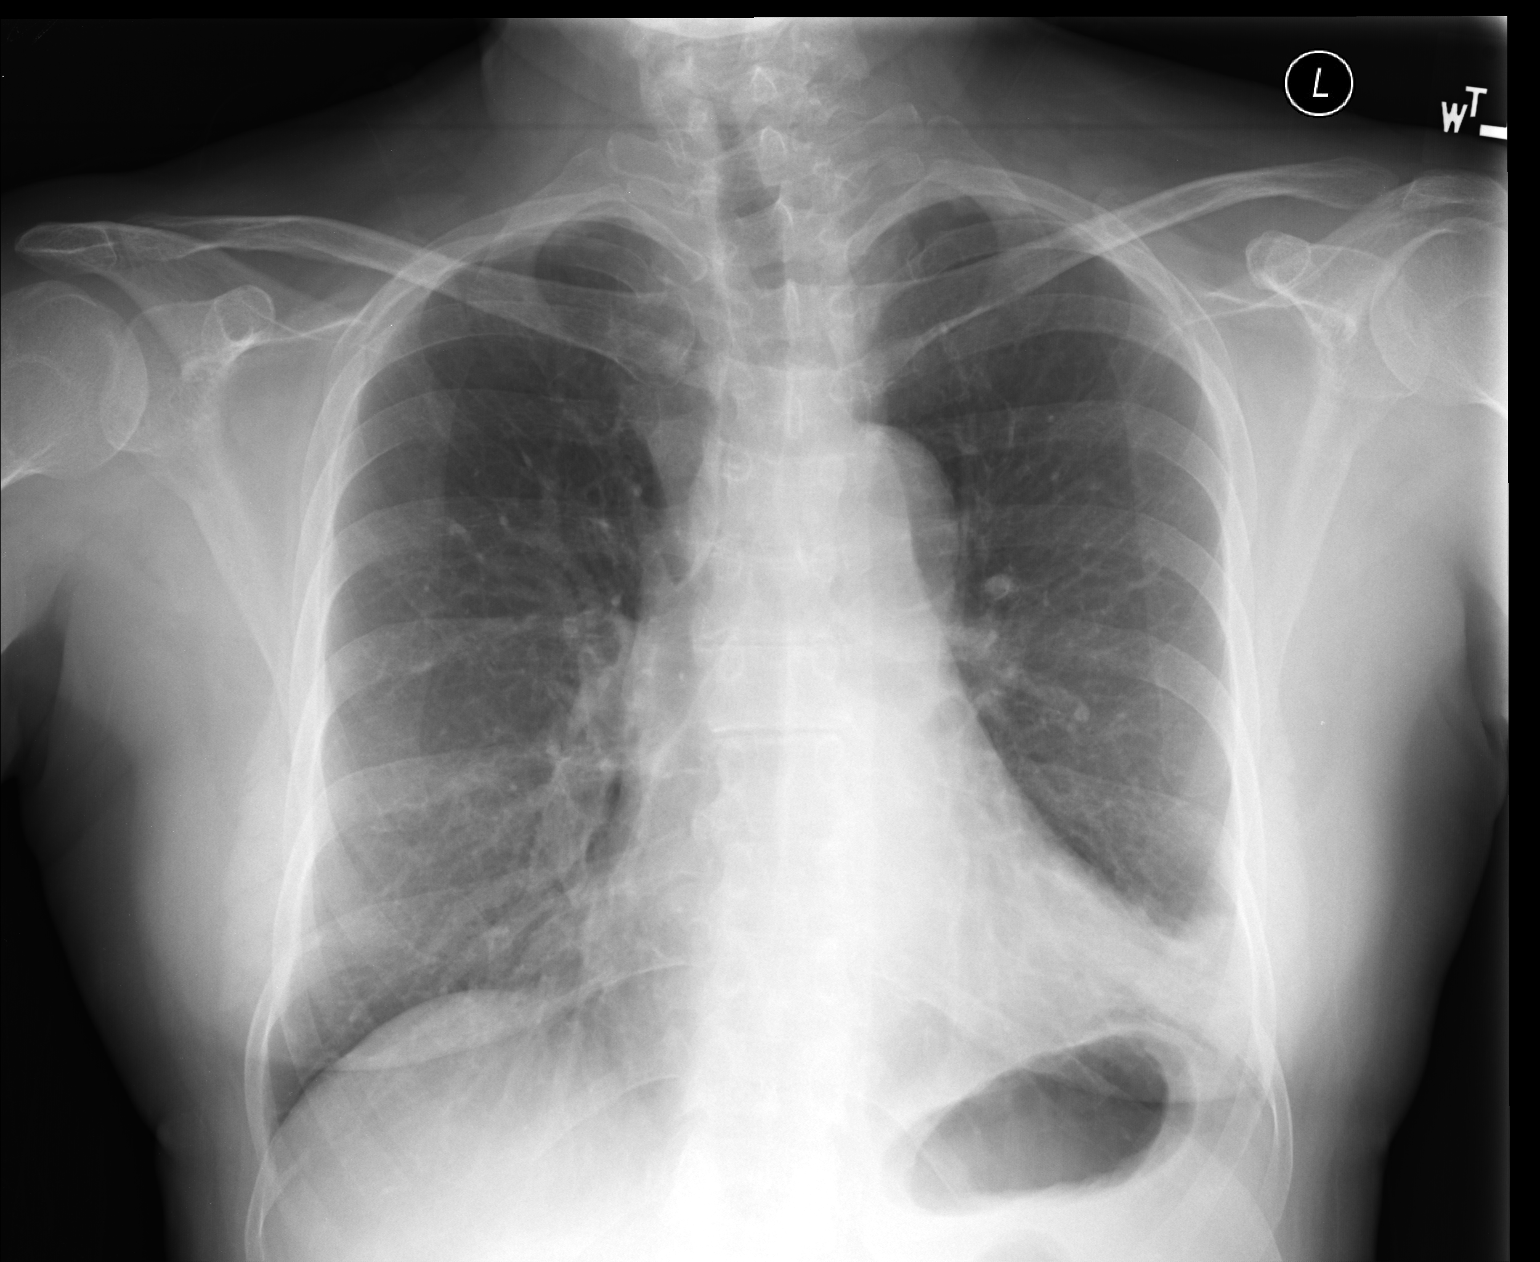

[lateral]
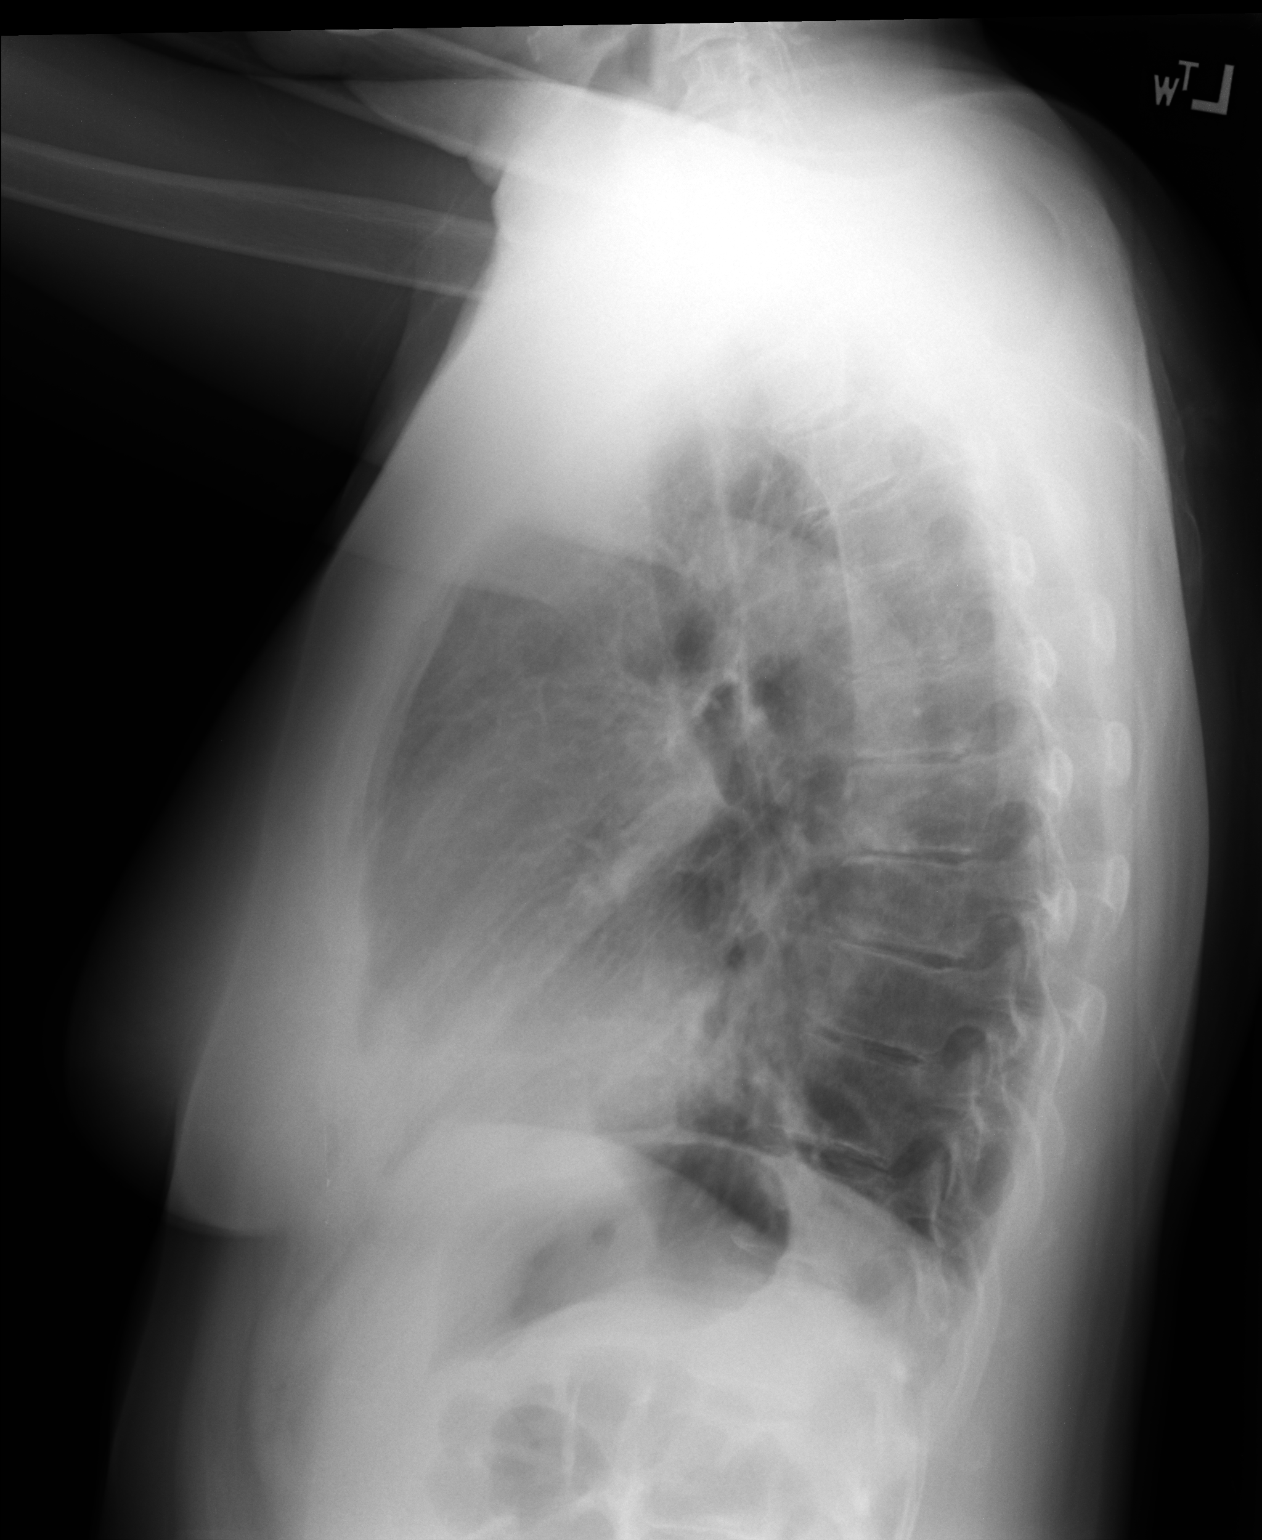

[2 of 2 positions shown; findings below may reference images not displayed]

FINDINGS: Two views of the chest demonstrate a focal area of increased opacity
in the lingula concerning for pneumonia. Clinical correlation and
follow-up to resolution recommended. There is no significant pleural
effusion. No pneumothorax. The cardiomediastinal silhouette is
within normal limits. The osseous structures are grossly
unremarkable.
IMPRESSION: Focal lingular opacity concerning for pneumonia. Clinical
correlation and follow-up recommended.

## 2017-01-19 ENCOUNTER — Encounter: Payer: Self-pay | Admitting: Physician Assistant

## 2017-01-19 ENCOUNTER — Ambulatory Visit (INDEPENDENT_AMBULATORY_CARE_PROVIDER_SITE_OTHER): Payer: BC Managed Care – PPO | Admitting: Physician Assistant

## 2017-01-19 VITALS — BP 142/94 | HR 62 | Temp 97.4°F | Resp 18 | Ht 66.25 in | Wt 160.6 lb

## 2017-01-19 DIAGNOSIS — Z0000 Encounter for general adult medical examination without abnormal findings: Secondary | ICD-10-CM

## 2017-01-19 DIAGNOSIS — Z13228 Encounter for screening for other metabolic disorders: Secondary | ICD-10-CM

## 2017-01-19 DIAGNOSIS — R030 Elevated blood-pressure reading, without diagnosis of hypertension: Secondary | ICD-10-CM | POA: Diagnosis not present

## 2017-01-19 DIAGNOSIS — Z130 Encounter for screening for diseases of the blood and blood-forming organs and certain disorders involving the immune mechanism: Secondary | ICD-10-CM

## 2017-01-19 DIAGNOSIS — N029 Recurrent and persistent hematuria with unspecified morphologic changes: Secondary | ICD-10-CM

## 2017-01-19 DIAGNOSIS — Z1389 Encounter for screening for other disorder: Secondary | ICD-10-CM

## 2017-01-19 DIAGNOSIS — Z1329 Encounter for screening for other suspected endocrine disorder: Secondary | ICD-10-CM

## 2017-01-19 DIAGNOSIS — Z114 Encounter for screening for human immunodeficiency virus [HIV]: Secondary | ICD-10-CM

## 2017-01-19 DIAGNOSIS — F411 Generalized anxiety disorder: Secondary | ICD-10-CM | POA: Diagnosis not present

## 2017-01-19 DIAGNOSIS — Z13220 Encounter for screening for lipoid disorders: Secondary | ICD-10-CM | POA: Diagnosis not present

## 2017-01-19 DIAGNOSIS — Z1159 Encounter for screening for other viral diseases: Secondary | ICD-10-CM

## 2017-01-19 DIAGNOSIS — Z23 Encounter for immunization: Secondary | ICD-10-CM

## 2017-01-19 LAB — POCT URINALYSIS DIP (MANUAL ENTRY)
Bilirubin, UA: NEGATIVE
Glucose, UA: NEGATIVE mg/dL
Ketones, POC UA: NEGATIVE mg/dL
Leukocytes, UA: NEGATIVE
Nitrite, UA: NEGATIVE
Protein Ur, POC: NEGATIVE mg/dL
Spec Grav, UA: 1.015 (ref 1.010–1.025)
Urobilinogen, UA: 0.2 E.U./dL
pH, UA: 5.5 (ref 5.0–8.0)

## 2017-01-19 MED ORDER — HYDROXYZINE HCL 25 MG PO TABS: ORAL_TABLET | ORAL | 0 refills | Status: DC

## 2017-01-19 MED ORDER — ZOSTER VACCINE LIVE 19400 UNT/0.65ML ~~LOC~~ SUSR: 0.6500 mL | Freq: Once | SUBCUTANEOUS | 0 refills | Status: AC

## 2017-01-19 NOTE — Patient Instructions (Addendum)
In terms of anxiety, try taking hydroxyzine as needed for moments you feel very anxious. You can start out with half a tablet and work up to one tablet as needed. A side effect of this medication is drowsiness. If this is not controlling your anxiety, we can always try a more long term medication. I will also give you info for local therapists if you want to reach out during this time to have someone to talk with. I recommend doing this especially due to the current stressors in your life. We can reevaluate how you are doing at your follow up visit for your pap. Please schedule that appointment within the next 3 months. In terms of elevated blood pressure, I would like you to check your blood pressure at least a couple times over the next week outside of the office and document these values. It is best if you check the blood pressure at different times in the day. Your goal is <140/90. If your values are consistently above this goal, please return to office for further evaluation. If you start to have chest pain, blurred vision, shortness of breath, severe headache, lower leg swelling, or nausea/vomiting please seek care immediately here or at the ED. In terms of calcium and vitamin d, see the info below and choose a vitamin with this in it or get a separate supplement for bone health. For shingles vaccine, please take the Rx to a local pharmacy. Thank you for letting me participate in your health and well being.   [facility] [place], [place] [address]   Burnard Leigh [phone]  Horton Finer [phone]  Everardo Beals [phone]     Health Maintenance for Postmenopausal Women Menopause is a normal process in which your reproductive ability comes to an end. This process happens gradually over a span of months to years, usually between the ages of 32 and 65. Menopause is complete when you have missed 12 consecutive menstrual periods. It is important to  talk with your health care provider about some of the most common conditions that affect postmenopausal women, such as heart disease, cancer, and bone loss (osteoporosis). Adopting a healthy lifestyle and getting preventive care can help to promote your health and wellness. Those actions can also lower your chances of developing some of these common conditions. What should I know about menopause? During menopause, you may experience a number of symptoms, such as:  Moderate-to-severe hot flashes.  Night sweats.  Decrease in sex drive.  Mood swings.  Headaches.  Tiredness.  Irritability.  Memory problems.  Insomnia.  Choosing to treat or not to treat menopausal changes is an individual decision that you make with your health care provider. What should I know about hormone replacement therapy and supplements? Hormone therapy products are effective for treating symptoms that are associated with menopause, such as hot flashes and night sweats. Hormone replacement carries certain risks, especially as you become older. If you are thinking about using estrogen or estrogen with progestin treatments, discuss the benefits and risks with your health care provider. What should I know about heart disease and stroke? Heart disease, heart attack, and stroke become more likely as you age. This may be due, in part, to the hormonal changes that your body experiences during menopause. These can affect how your body processes dietary fats, triglycerides, and cholesterol. Heart attack and stroke are both medical emergencies. There are many things that you can do to help prevent heart disease and stroke:  Have your  blood pressure checked at least every 1-2 years. High blood pressure causes heart disease and increases the risk of stroke.  If you are 87-8 years old, ask your health care provider if you should take aspirin to prevent a heart attack or a stroke.  Do not use any tobacco products, including  cigarettes, chewing tobacco, or electronic cigarettes. If you need help quitting, ask your health care provider.  It is important to eat a healthy diet and maintain a healthy weight. ? Be sure to include plenty of vegetables, fruits, low-fat dairy products, and lean protein. ? Avoid eating foods that are high in solid fats, added sugars, or salt (sodium).  Get regular exercise. This is one of the most important things that you can do for your health. ? Try to exercise for at least 150 minutes each week. The type of exercise that you do should increase your heart rate and make you sweat. This is known as moderate-intensity exercise. ? Try to do strengthening exercises at least twice each week. Do these in addition to the moderate-intensity exercise.  Know your numbers.Ask your health care provider to check your cholesterol and your blood glucose. Continue to have your blood tested as directed by your health care provider.  What should I know about cancer screening? There are several types of cancer. Take the following steps to reduce your risk and to catch any cancer development as early as possible. Breast Cancer  Practice breast self-awareness. ? This means understanding how your breasts normally appear and feel. ? It also means doing regular breast self-exams. Let your health care provider know about any changes, no matter how small.  If you are 39 or older, have a clinician do a breast exam (clinical breast exam or CBE) every year. Depending on your age, family history, and medical history, it may be recommended that you also have a yearly breast X-ray (mammogram).  If you have a family history of breast cancer, talk with your health care provider about genetic screening.  If you are at high risk for breast cancer, talk with your health care provider about having an MRI and a mammogram every year.  Breast cancer (BRCA) gene test is recommended for women who have family members with  BRCA-related cancers. Results of the assessment will determine the need for genetic counseling and BRCA1 and for BRCA2 testing. BRCA-related cancers include these types: ? Breast. This occurs in males or females. ? Ovarian. ? Tubal. This may also be called fallopian tube cancer. ? Cancer of the abdominal or pelvic lining (peritoneal cancer). ? Prostate. ? Pancreatic.  Cervical, Uterine, and Ovarian Cancer Your health care provider may recommend that you be screened regularly for cancer of the pelvic organs. These include your ovaries, uterus, and vagina. This screening involves a pelvic exam, which includes checking for microscopic changes to the surface of your cervix (Pap test).  For women ages 21-65, health care providers may recommend a pelvic exam and a Pap test every three years. For women ages 7-65, they may recommend the Pap test and pelvic exam, combined with testing for human papilloma virus (HPV), every five years. Some types of HPV increase your risk of cervical cancer. Testing for HPV may also be done on women of any age who have unclear Pap test results.  Other health care providers may not recommend any screening for nonpregnant women who are considered low risk for pelvic cancer and have no symptoms. Ask your health care provider if a screening  pelvic exam is right for you.  If you have had past treatment for cervical cancer or a condition that could lead to cancer, you need Pap tests and screening for cancer for at least 20 years after your treatment. If Pap tests have been discontinued for you, your risk factors (such as having a new sexual partner) need to be reassessed to determine if you should start having screenings again. Some women have medical problems that increase the chance of getting cervical cancer. In these cases, your health care provider may recommend that you have screening and Pap tests more often.  If you have a family history of uterine cancer or ovarian  cancer, talk with your health care provider about genetic screening.  If you have vaginal bleeding after reaching menopause, tell your health care provider.  There are currently no reliable tests available to screen for ovarian cancer.  Lung Cancer Lung cancer screening is recommended for adults 41-91 years old who are at high risk for lung cancer because of a history of smoking. A yearly low-dose CT scan of the lungs is recommended if you:  Currently smoke.  Have a history of at least 30 pack-years of smoking and you currently smoke or have quit within the past 15 years. A pack-year is smoking an average of one pack of cigarettes per day for one year.  Yearly screening should:  Continue until it has been 15 years since you quit.  Stop if you develop a health problem that would prevent you from having lung cancer treatment.  Colorectal Cancer  This type of cancer can be detected and can often be prevented.  Routine colorectal cancer screening usually begins at age 72 and continues through age 69.  If you have risk factors for colon cancer, your health care provider may recommend that you be screened at an earlier age.  If you have a family history of colorectal cancer, talk with your health care provider about genetic screening.  Your health care provider may also recommend using home test kits to check for hidden blood in your stool.  A small camera at the end of a tube can be used to examine your colon directly (sigmoidoscopy or colonoscopy). This is done to check for the earliest forms of colorectal cancer.  Direct examination of the colon should be repeated every 5-10 years until age 69. However, if early forms of precancerous polyps or small growths are found or if you have a family history or genetic risk for colorectal cancer, you may need to be screened more often.  Skin Cancer  Check your skin from head to toe regularly.  Monitor any moles. Be sure to tell your health  care provider: ? About any new moles or changes in moles, especially if there is a change in a mole's shape or color. ? If you have a mole that is larger than the size of a pencil eraser.  If any of your family members has a history of skin cancer, especially at a young age, talk with your health care provider about genetic screening.  Always use sunscreen. Apply sunscreen liberally and repeatedly throughout the day.  Whenever you are outside, protect yourself by wearing long sleeves, pants, a wide-brimmed hat, and sunglasses.  What should I know about osteoporosis? Osteoporosis is a condition in which bone destruction happens more quickly than new bone creation. After menopause, you may be at an increased risk for osteoporosis. To help prevent osteoporosis or the bone fractures that can  happen because of osteoporosis, the following is recommended:  If you are 27-59 years old, get at least 1,000 mg of calcium and at least 600 mg of vitamin D per day.  If you are older than age 59 but younger than age 65, get at least 1,200 mg of calcium and at least 600 mg of vitamin D per day.  If you are older than age 17, get at least 1,200 mg of calcium and at least 800 mg of vitamin D per day.  Smoking and excessive alcohol intake increase the risk of osteoporosis. Eat foods that are rich in calcium and vitamin D, and do weight-bearing exercises several times each week as directed by your health care provider. What should I know about how menopause affects my mental health? Depression may occur at any age, but it is more common as you become older. Common symptoms of depression include:  Low or sad mood.  Changes in sleep patterns.  Changes in appetite or eating patterns.  Feeling an overall lack of motivation or enjoyment of activities that you previously enjoyed.  Frequent crying spells.  Talk with your health care provider if you think that you are experiencing depression. What should I know  about immunizations? It is important that you get and maintain your immunizations. These include:  Tetanus, diphtheria, and pertussis (Tdap) booster vaccine.  Influenza every year before the flu season begins.  Pneumonia vaccine.  Shingles vaccine.  Your health care provider may also recommend other immunizations. This information is not intended to replace advice given to you by your health care provider. Make sure you discuss any questions you have with your health care provider. Document Released: 09/24/2005 Document Revised: 02/20/2016 Document Reviewed: 05/06/2015 Elsevier Interactive Patient Education  2018 [employer].       Constipation, Adult Constipation is when a person:  Poops (has a bowel movement) fewer times in a week than normal.  Has a hard time pooping.  Has poop that is dry, hard, or bigger than normal.  Follow these instructions at home: Eating and drinking   Eat foods that have a lot of fiber, such as: ? Fresh fruits and vegetables. ? Whole grains. ? Beans.  Eat less of foods that are high in fat, low in fiber, or overly processed, such as: ? [place] fries. ? Hamburgers. ? Cookies. ? Candy. ? Soda.  Drink enough fluid to keep your pee (urine) clear or pale yellow. General instructions  Exercise regularly or as told by your doctor.  Go to the restroom when you feel like you need to poop. Do not hold it in.  Take over-the-counter and prescription medicines only as told by your doctor. These include any fiber supplements.  Do pelvic floor retraining exercises, such as: ? Doing deep breathing while relaxing your lower belly (abdomen). ? Relaxing your pelvic floor while pooping.  Watch your condition for any changes.  Keep all follow-up visits as told by your doctor. This is important. Contact a doctor if:  You have pain that gets worse.  You have a fever.  You have not pooped for 4 days.  You throw up (vomit).  You are not  hungry.  You lose weight.  You are bleeding from the anus.  You have thin, pencil-like poop (stool). Get help right away if:  You have a fever, and your symptoms suddenly get worse.  You leak poop or have blood in your poop.  Your belly feels hard or bigger than normal (is bloated).  You have very bad belly pain.  You feel dizzy or you faint. This information is not intended to replace advice given to you by your health care provider. Make sure you discuss any questions you have with your health care provider. Document Released: 01/19/2008 Document Revised: 02/20/2016 Document Reviewed: 01/21/2016 Elsevier Interactive Patient Education  2017 [place] Need to Know About Vitamin D Vitamin D is a nutrient that helps you to absorb calcium from food. It plays a key role in building and maintaining healthy bones and teeth, muscle function, and immunity from infections. Getting plenty of vitamin D can help to keep you from developing conditions that cause bones to be brittle, such as rickets or osteoporosis. If you are over age 28, not having enough vitamin D may weaken your muscles and bones and increase your risk for falls and fractures. Our bodies make vitamin D when our skin is exposed to direct sunlight. However, for many people, this may not be enough vitamin D to meet the body's needs. When you get too little vitamin D, it is called a deficiency. You may be at risk for a vitamin D deficiency if you:  Are pregnant.  Are obese.  Are over 37 years old.  Have dark skin.  Spend most of your day indoors.  Live in [place].  Cover your skin all of the time when you are outdoors.  Have a condition that limits your ability to absorb fat, such as inflammatory bowel disease.  Have had gastric bypass surgery.  Breastfed infants are also at risk for vitamin D deficiency. If you are at risk for vitamin D deficiency, your health care provider may  recommend that you take a vitamin D supplement. If you have certain diseases, your health care provider may recommend that you take a vitamin D supplement as part of your treatment. What is my plan? General recommendations for daily vitamin D intake vary by these categories:  Infants: 400 International Units.  Children over 47-year-old: 600 International Units.  Adults: 600 International Units.  Adults over 39 years old: 39 International Units.  Pregnant and breastfeeding women: 600 International Units.  Your health care provider may recommend a different amount of vitamin D intake based on your specific needs and your overall health. What do I need to know about vitamin D intake? You can meet your daily vitamin D needs from:  Direct exposure to sunlight.  Food.  Dietary supplements.  Talk with your health care provider before you start taking any vitamin D supplements. You may be more sensitive to the side effects of vitamin D supplements if you are on certain medicines or have certain medical conditions. Be sure to tell your health care provider about all medicines you are taking, including vitamin, mineral, and herbal supplements. Take medicines and supplements only as told by your health care provider. What foods contain vitamin D? Grains Fortified cereals. Meats and Other Protein Sources Beef liver. Egg yolk. [place]. Salmon. Swordfish. Shrimp. Sardines. [place]. Dairy Fortified milk. Cheese. Fortified yogurt. Beverages Infant formula. Fortified orange juice. Fortified soy milk. What are some tips for getting enough vitamin D?  Expose your skin to direct sunlight for at least 15 minutes every day. If you have dark skin, you may need to expose your skin for a longer period of time. Make sure youprotect your skin from too much sun exposure. This helps to prevent skin cancer.  Eat a balanced diet each day that includes both of  these: ? 2 -3 servings of meat and meat  alternatives. ? 2 servings of dairy.  Talk with your health care provider about taking a vitamin D supplement if: ? You live in far [place]. ? You live in a location that has cloudy days for half of the year. ? You regularly avoid exposure to sunlight. ? You completely cover your body with clothes or sunscreen when you are outdoors. ? You are over 71 years old. Taking a supplement may help to strengthen your bones and prevent fractures.  Infants who consume less than 32 oz of infant formula each day should receive a daily vitamin D supplement.  If your doctor has recommended a vitamin D supplement to help treat a disease, you should have your blood levels of vitamin D checked every 3 months. This information is not intended to replace advice given to you by your health care provider. Make sure you discuss any questions you have with your health care provider. Document Released: 04/23/2015 Document Revised: 03/27/2016 Document Reviewed: 11/27/2014 Elsevier Interactive Patient Education  2018 [place] protect organs, store calcium, and anchor muscles. Good health habits, such as eating nutritious foods and exercising regularly, are important for maintaining healthy bones. They can also help to prevent a condition that causes bones to lose density and become weak and brittle (osteoporosis). Why is bone mass important? Bone mass refers to the amount of bone tissue that you have. The higher your bone mass, the stronger your bones. An important step toward having healthy bones throughout life is to have strong and dense bones during childhood. A young adult who has a high bone mass is more likely to have a high bone mass later in life. Bone mass at its greatest it is called peak bone mass. A large decline in bone mass occurs in older adults. In women, it occurs about the time of menopause. During this time, it is important to practice good health habits, because  if more bone is lost than what is replaced, the bones will become less healthy and more likely to break (fracture). If you find that you have a low bone mass, you may be able to prevent osteoporosis or further bone loss by changing your diet and lifestyle. How can I find out if my bone mass is low? Bone mass can be measured with an X-ray test that is called a bone mineral density (BMD) test. This test is recommended for all women who are age 60 or older. It may also be recommended for men who are age 2 or older, or for people who are more likely to develop osteoporosis due to:  Having bones that break easily.  Having a long-term disease that weakens bones, such as kidney disease or rheumatoid arthritis.  Having menopause earlier than normal.  Taking medicine that weakens bones, such as steroids, thyroid hormones, or hormone treatment for breast cancer or prostate cancer.  Smoking.  Drinking three or more alcoholic drinks each day.  What are the nutritional recommendations for healthy bones? To have healthy bones, you need to get enough of the right minerals and vitamins. Most nutrition experts recommend getting these nutrients from the foods that you eat. Nutritional recommendations vary from person to person. Ask your health care provider what is healthy for you. Here are some general guidelines. Calcium Recommendations Calcium is the most important (essential) mineral for bone health. Most people can get enough calcium from their diet, but supplements may be  recommended for people who are at risk for osteoporosis. Good sources of calcium include:  Dairy products, such as low-fat or nonfat milk, cheese, and yogurt.  Dark green leafy vegetables, such as bok choy and broccoli.  Calcium-fortified foods, such as orange juice, cereal, bread, soy beverages, and tofu products.  Nuts, such as almonds.  Follow these recommended amounts for daily calcium intake:  Children, age 69?3: 700  mg.  Children, age 23?8: 1,000 mg.  Children, age 58?13: 1,300 mg.  Teens, age 22?18: 1,300 mg.  Adults, age 62?50: 1,000 mg.  Adults, age 72?70: ? Men: 1,000 mg. ? Women: 1,200 mg.  Adults, age 61 or older: 1,200 mg.  Pregnant and breastfeeding females: ? Teens: 1,300 mg. ? Adults: 1,000 mg.  Vitamin D Recommendations Vitamin D is the most essential vitamin for bone health. It helps the body to absorb calcium. Sunlight stimulates the skin to make vitamin D, so be sure to get enough sunlight. If you live in a cold climate or you do not get outside often, your health care provider may recommend that you take vitamin D supplements. Good sources of vitamin D in your diet include:  Egg yolks.  Saltwater fish.  Milk and cereal fortified with vitamin D.  Follow these recommended amounts for daily vitamin D intake:  Children and teens, age 62?18: 53 international units.  Adults, age 77 or younger: 400-800 international units.  Adults, age 231 or older: 800-1,000 international units.  Other Nutrients Other nutrients for bone health include:  Phosphorus. This mineral is found in meat, poultry, dairy foods, nuts, and legumes. The recommended daily intake for adult men and adult women is 700 mg.  Magnesium. This mineral is found in seeds, nuts, dark green vegetables, and legumes. The recommended daily intake for adult men is 400?420 mg. For adult women, it is 310?320 mg.  Vitamin K. This vitamin is found in green leafy vegetables. The recommended daily intake is 120 mg for adult men and 90 mg for adult women.  What type of physical activity is best for building and maintaining healthy bones? Weight-bearing and strength-building activities are important for building and maintaining peak bone mass. Weight-bearing activities cause muscles and bones to work against gravity. Strength-building activities increases muscle strength that supports bones. Weight-bearing and muscle-building  activities include:  Walking and hiking.  Jogging and running.  Dancing.  Gym exercises.  Lifting weights.  Tennis and racquetball.  Climbing stairs.  Aerobics.  Adults should get at least 30 minutes of moderate physical activity on most days. Children should get at least 60 minutes of moderate physical activity on most days. Ask your health care provide what type of exercise is best for you. Where can I find more information? For more information, check out the following websites:  [place]: YardHomes.se  [employer] of Health: http://www.niams.AnonymousEar.fr.asp  This information is not intended to replace advice given to you by your health care provider. Make sure you discuss any questions you have with your health care provider. Document Released: 10/23/2003 Document Revised: 02/20/2016 Document Reviewed: 08/07/2014 Elsevier Interactive Patient Education  2018 [employer].   IF you received an x-ray today, you will receive an invoice from [facility]. Please contact [facility] at [phone] with questions or concerns regarding your invoice.   IF you received labwork today, you will receive an invoice from [place]. Please contact [facility] at [phone] with questions or concerns regarding your invoice.   Our billing staff will not be able to  assist you with questions regarding bills from these companies.  You will be contacted with the lab results as soon as they are available. The fastest way to get your results is to activate your My Chart account. Instructions are located on the last page of this paperwork. If you have not heard from [place] regarding the results in 2 weeks, please contact this office.

## 2017-01-19 NOTE — Progress Notes (Signed)
   Subjective:    Patient ID: Mary Humphrey, female    DOB: Dec 17, 1955, 61 y.o.   MRN: [id number]  HPI    Review of Systems  Psychiatric/Behavioral: The patient is nervous/anxious.        Objective:   Physical Exam        Assessment & Plan:

## 2017-01-19 NOTE — Progress Notes (Signed)
Mary Humphrey  MRN: [id number] DOB: 1955/10/02  Subjective:  Pt is a 61 y.o. female who presents for annual physical exam. Pt is fasting today.   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 She is married with two children. She is sexually active with monogamous husband.   Diet: A lot of fruits, veggies, and chicken. Eats yogurt daily. Sodium free mostly. Drinks mostly water. Takes multivitamin daily.   Exercise: 4 x week to the [facility].   Sleep: 6-7 hours a night.   Bowel movements: Once every couple days.   Pt is postmenopauseal. Last cycle was 10 years ago.    Last pap smear: 04/17/2014 Last mammogram: 06/25/2016 Last colonoscopy: 06/24/2014  Vaccinations      Tetanus: 06/14/2008      Zostavax: Never        Acute issues:  1) Anxiety: For the past two months, she has been feeling more anxious/on edge than normal. Has never struggles with anxiety but has been under a lot of stress because she is selling her house and her husband was just in the ICU. He is doing better now but she is still feeling stressed. She is not interested in a long term medication at this time. She is just wondering if there is something she can take at moments of increased anxiety. Denies chest pain, palpitations, SOB, dysphoric mood, and suicidal ideations.   Patient Active Problem List   Diagnosis Date Noted  . History of colonic polyps 07/09/2014  . 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 Asthma 08/18/2012  . Allergic rhinitis 08/18/2012    Current Outpatient Prescriptions on File Prior to Visit  Medication Sig Dispense Refill  . albuterol (PROVENTIL HFA;VENTOLIN HFA) 108 (90 BASE) MCG/ACT inhaler Inhale into the lungs every 6 (six) hours as needed for wheezing or shortness of breath.    Marland Kitchen azelastine (ASTELIN) 0.1 % nasal spray Place into both nostrils 2 (two) times daily. Use in each nostril as directed    . cetirizine (ZYRTEC) 10 MG tablet Take 10 mg by mouth daily.    Marland Kitchen EPINEPHrine 0.3 mg/0.3 mL IJ SOAJ  injection Inject into the muscle once.    . Fluticasone-Salmeterol (ADVAIR) 100-50 MCG/DOSE AEPB Inhale 1 puff into the lungs every 12 (twelve) hours. 60 each 11  . Montelukast Sodium (SINGULAIR PO) Take by mouth.    . naproxen (NAPROSYN) 500 MG tablet Take 1 tablet (500 mg total) by mouth 2 (two) times daily as needed. 30 tablet 0  . UNABLE TO FIND Med Name: allergy shots weekly     No current facility-administered medications on file prior to visit.     Allergies  Allergen Reactions  . Doxycycline Other (See Comments)    Gi UPSET-DIARRHEAQ    Social History   Social History  . Marital status: Married    Spouse name: N/A  . Number of children: N/A  . Years of education: N/A   Occupational History  . Administration [employer]   Social History Main Topics  . Smoking status: Never Smoker  . Smokeless tobacco: Never Used  . Alcohol use 0.0 oz/week     Comment: 2-3 drinks-socially  . Drug use: No  . Sexual activity: Yes    Birth control/ protection: Post-menopausal   Other Topics Concern  . None   Social History Narrative   Marital status:  Married x 33 years      Children: two children; no grandchildren      Lives: with husband  Employment:  Surveyor, quantity at [employer]     Tobacco: none      Alcohol:  Socially three times weekly      Exercise: none    Past Surgical History:  Procedure Laterality Date  . KNEE CARTILAGE SURGERY     Meniscus  . SINUS ENDO W/FUSION Bilateral 09/09/2014   Procedure: BILATERAL ENDOSCOPIC TOTAL ETHMOIDECTOMY/BILATERAL ENDOSCOPIC MAXILLARY ANTROSTOMY/BILATERAL ENDOSCOPIC FRONTAL RECESS EXPLORATION/BILATERAL ENDOSCOPIC SPHENOIDECTOMY WITH FUSION NAVIGATION;  Surgeon: Ascencion Dike, MD;  Location: [facility];  Service: ENT;  Laterality: Bilateral;    Family History  Problem Relation Age of Onset  . Asthma Sister   . Heart disease Father   . Colon cancer Neg Hx   . Rectal cancer Neg Hx   . Stomach cancer Neg Hx     Review  of Systems  Constitutional: Negative for activity change, appetite change, chills, diaphoresis, fatigue, fever and unexpected weight change.  HENT: Negative for congestion, dental problem, drooling, ear discharge, ear pain, facial swelling, hearing loss, mouth sores, nosebleeds, postnasal drip, rhinorrhea, sinus pain, sinus pressure, sneezing, sore throat, tinnitus, trouble swallowing and voice change.   Eyes: Negative for photophobia, pain, discharge, redness, itching and visual disturbance.  Respiratory: Negative for apnea, cough, choking, chest tightness, shortness of breath, wheezing and stridor.   Cardiovascular: Negative for chest pain, palpitations and leg swelling.  Gastrointestinal: Negative for abdominal distention, abdominal pain, anal bleeding, blood in stool, constipation, diarrhea, nausea, rectal pain and vomiting.  Endocrine: Negative for cold intolerance, heat intolerance, polydipsia, polyphagia and polyuria.  Genitourinary: Negative for decreased urine volume, difficulty urinating, dyspareunia, dysuria, enuresis, flank pain, frequency, genital sores, hematuria, menstrual problem, pelvic pain, urgency, vaginal bleeding, vaginal discharge and vaginal pain.  Musculoskeletal: Negative for arthralgias, back pain, gait problem, joint swelling, myalgias, neck pain and neck stiffness.  Skin: Negative for color change, pallor, rash and wound.  Allergic/Immunologic: Negative for environmental allergies, food allergies and immunocompromised state.  Neurological: Negative for dizziness, tremors, seizures, syncope, facial asymmetry, speech difficulty, weakness, light-headedness, numbness and headaches.  Hematological: Negative for adenopathy. Does not bruise/bleed easily.  Psychiatric/Behavioral: Negative for agitation, behavioral problems, confusion, decreased concentration, dysphoric mood, hallucinations, self-injury, sleep disturbance and suicidal ideas. The patient is nervous/anxious (for past  2 months). The patient is not hyperactive.     Objective:  BP (!) 142/94   Pulse 62   Temp 97.4 F (36.3 C) (Oral)   Resp 18   Ht 5' 6.25" (1.683 m)   Wt 160 lb 9.6 oz (72.8 kg)   SpO2 98%   BMI 25.73 kg/m   Physical Exam  Constitutional: She is oriented to person, place, and time and well-developed, well-nourished, and in no distress.  HENT:  Head: Normocephalic and atraumatic.  Right Ear: Hearing, tympanic membrane, external ear and ear canal normal.  Left Ear: Hearing, tympanic membrane, external ear and ear canal normal.  Nose: Nose normal.  Mouth/Throat: Uvula is midline, oropharynx is clear and moist and mucous membranes are normal. No oropharyngeal exudate.  Eyes: Conjunctivae, EOM and lids are normal. Pupils are equal, round, and reactive to light. No scleral icterus.  Neck: Trachea normal and normal range of motion. No thyroid mass and no thyromegaly present.  Cardiovascular: Normal rate, regular rhythm, normal heart sounds and intact distal pulses.   Pulmonary/Chest: Effort normal and breath sounds normal.  Abdominal: Soft. Normal appearance and bowel sounds are normal. There is no tenderness.  Lymphadenopathy:       Head (right side): No  tonsillar, no preauricular, no posterior auricular and no occipital adenopathy present.       Head (left side): No tonsillar, no preauricular, no posterior auricular and no occipital adenopathy present.    She has no cervical adenopathy.       Right: No supraclavicular adenopathy present.       Left: No supraclavicular adenopathy present.  Neurological: She is alert and oriented to person, place, and time. She has normal sensation, normal strength and normal reflexes. Gait normal.  Skin: Skin is warm and dry.  Psychiatric: Affect normal.    Visual Acuity Screening   Right eye Left eye Both eyes  Without correction:     With correction: 20/30 20/25-1 20/25   GAD 7 : Generalized Anxiety Score 01/19/2017  Nervous, Anxious, on Edge 2    Control/stop worrying 1  Worry too much - different things 1  Trouble relaxing 0  Restless 1  Easily annoyed or irritable 2  Afraid - awful might happen 0  Total GAD 7 Score 7  Anxiety Difficulty Not difficult at all     Assessment and Plan :  Discussed healthy lifestyle, diet, exercise, preventative care, vaccinations, and addressed patient's concerns. Plan for follow up in 3 months. Otherwise, plan for specific conditions below.  1. Annual physical exam Await lab results. Instructed to start taking daily vit d and calcium supplementation.   2. Screening for endocrine, metabolic and immunity disorder - CMP14+EGFR  3. Screening, lipid - Lipid panel  4. Screening for thyroid disorder - TSH  5. Screening for hematuria or proteinuria - POCT urinalysis dipstick  6. Screening for HIV (human immunodeficiency virus) - HIV antibody  7. Encounter for hepatitis C screening test for low risk patient - Hepatitis C antibody  8. Screening, anemia, deficiency, iron - CBC with Differential/Platelet  9. Benign hematuria - Urine Microscopic  10. Encounter for Zostavax administration - Zoster Vaccine Live, PF, (ZOSTAVAX) 28638 UNT/0.65ML injection; Inject 19,400 Units into the skin once.  Dispense: 1 each; Refill: 0  11. Anxiety state Given contact info for local therapists. Return in 3 months for reevaluation. If symptoms still persisting at this time, consider SSRI initiation. Pt agrees with tx plan.  - hydrOXYzine (ATARAX/VISTARIL) 25 MG tablet; Take 0.5-1 pill as needed for anxiety.  Dispense: 30 tablet; Refill: 0  12. Elevated BP without diagnosis of hypertension Asymptomatic. May be due to stress. Instructed to check bp outside office over the next 2 weeks. If consistently >140/90, return for further evaluation. Given strict ED precautions.   Tenna Delaine, PA-C  [facility] 01/19/2017 7:19 PM

## 2017-01-20 LAB — CMP14+EGFR
ALT: 17 IU/L (ref 0–32)
AST: 16 IU/L (ref 0–40)
Albumin/Globulin Ratio: 1.7 (ref 1.2–2.2)
Albumin: 4.5 g/dL (ref 3.6–4.8)
Alkaline Phosphatase: 70 IU/L (ref 39–117)
BUN/Creatinine Ratio: 18 (ref 12–28)
BUN: 14 mg/dL (ref 8–27)
Bilirubin Total: 1.2 mg/dL (ref 0.0–1.2)
CO2: 24 mmol/L (ref 18–29)
Calcium: 9.8 mg/dL (ref 8.7–10.3)
Chloride: 102 mmol/L (ref 96–106)
Creatinine, Ser: 0.8 mg/dL (ref 0.57–1.00)
GFR calc Af Amer: 93 mL/min/{1.73_m2} (ref >59)
GFR calc non Af Amer: 80 mL/min/{1.73_m2} (ref >59)
Globulin, Total: 2.7 g/dL (ref 1.5–4.5)
Glucose: 93 mg/dL (ref 65–99)
Potassium: 4.1 mmol/L (ref 3.5–5.2)
Sodium: 142 mmol/L (ref 134–144)
Total Protein: 7.2 g/dL (ref 6.0–8.5)

## 2017-01-20 LAB — URINALYSIS, MICROSCOPIC ONLY
Bacteria, UA: NONE SEEN
Casts: NONE SEEN /lpf

## 2017-01-20 LAB — CBC WITH DIFFERENTIAL/PLATELET
Basophils Absolute: 0.1 10*3/uL (ref 0.0–0.2)
Basos: 1 %
EOS (ABSOLUTE): 0.4 10*3/uL (ref 0.0–0.4)
Eos: 6 %
Hematocrit: 41.5 % (ref 34.0–46.6)
Hemoglobin: 13.7 g/dL (ref 11.1–15.9)
Immature Grans (Abs): 0 10*3/uL (ref 0.0–0.1)
Immature Granulocytes: 0 %
Lymphocytes Absolute: 1.8 10*3/uL (ref 0.7–3.1)
Lymphs: 25 %
MCH: 30 pg (ref 26.6–33.0)
MCHC: 33 g/dL (ref 31.5–35.7)
MCV: 91 fL (ref 79–97)
Monocytes Absolute: 0.5 10*3/uL (ref 0.1–0.9)
Monocytes: 7 %
Neutrophils Absolute: 4.4 10*3/uL (ref 1.4–7.0)
Neutrophils: 61 %
Platelets: 289 10*3/uL (ref 150–379)
RBC: 4.56 x10E6/uL (ref 3.77–5.28)
RDW: 13.8 % (ref 12.3–15.4)
WBC: 7.2 10*3/uL (ref 3.4–10.8)

## 2017-01-20 LAB — TSH: TSH: 1.13 u[IU]/mL (ref 0.450–4.500)

## 2017-01-20 LAB — HIV ANTIBODY (ROUTINE TESTING W REFLEX): HIV Screen 4th Generation wRfx: NONREACTIVE

## 2017-01-20 LAB — HEPATITIS C ANTIBODY: Hep C Virus Ab: 0.1 s/co ratio (ref 0.0–0.9)

## 2017-01-20 LAB — LIPID PANEL
Chol/HDL Ratio: 3.6 ratio (ref 0.0–4.4)
Cholesterol, Total: 189 mg/dL (ref 100–199)
HDL: 52 mg/dL (ref >39)
LDL Calculated: 115 mg/dL — ABNORMAL HIGH (ref 0–99)
Triglycerides: 111 mg/dL (ref 0–149)
VLDL Cholesterol Cal: 22 mg/dL (ref 5–40)

## 2017-05-30 ENCOUNTER — Other Ambulatory Visit: Payer: Self-pay | Admitting: Physician Assistant

## 2017-05-30 DIAGNOSIS — Z1231 Encounter for screening mammogram for malignant neoplasm of breast: Secondary | ICD-10-CM

## 2017-06-27 ENCOUNTER — Ambulatory Visit
Admission: RE | Admit: 2017-06-27 | Discharge: 2017-06-27 | Disposition: A | Discharge status: Home / Self Care | Payer: BC Managed Care – PPO | Source: Ambulatory Visit | Attending: Physician Assistant | Admitting: Physician Assistant

## 2017-06-27 DIAGNOSIS — Z1231 Encounter for screening mammogram for malignant neoplasm of breast: Secondary | ICD-10-CM

## 2017-06-27 NOTE — Telephone Encounter (Signed)
done

## 2017-06-27 NOTE — Telephone Encounter (Signed)
Done

## 2017-08-16 DIAGNOSIS — L309 Dermatitis, unspecified: Secondary | ICD-10-CM

## 2017-08-16 HISTORY — DX: Dermatitis, unspecified: L30.9

## 2017-12-21 IMAGING — DX DG KNEE COMPLETE 4+V*R*
4 series · 4 of 4 positions shown · non-contrast
Comparison: None.

CLINICAL DATA: Right-sided knee pain and inability to bear weight
since last night. No injury.

EXAM:
RIGHT KNEE - COMPLETE 4+ VIEW

[knee ap]
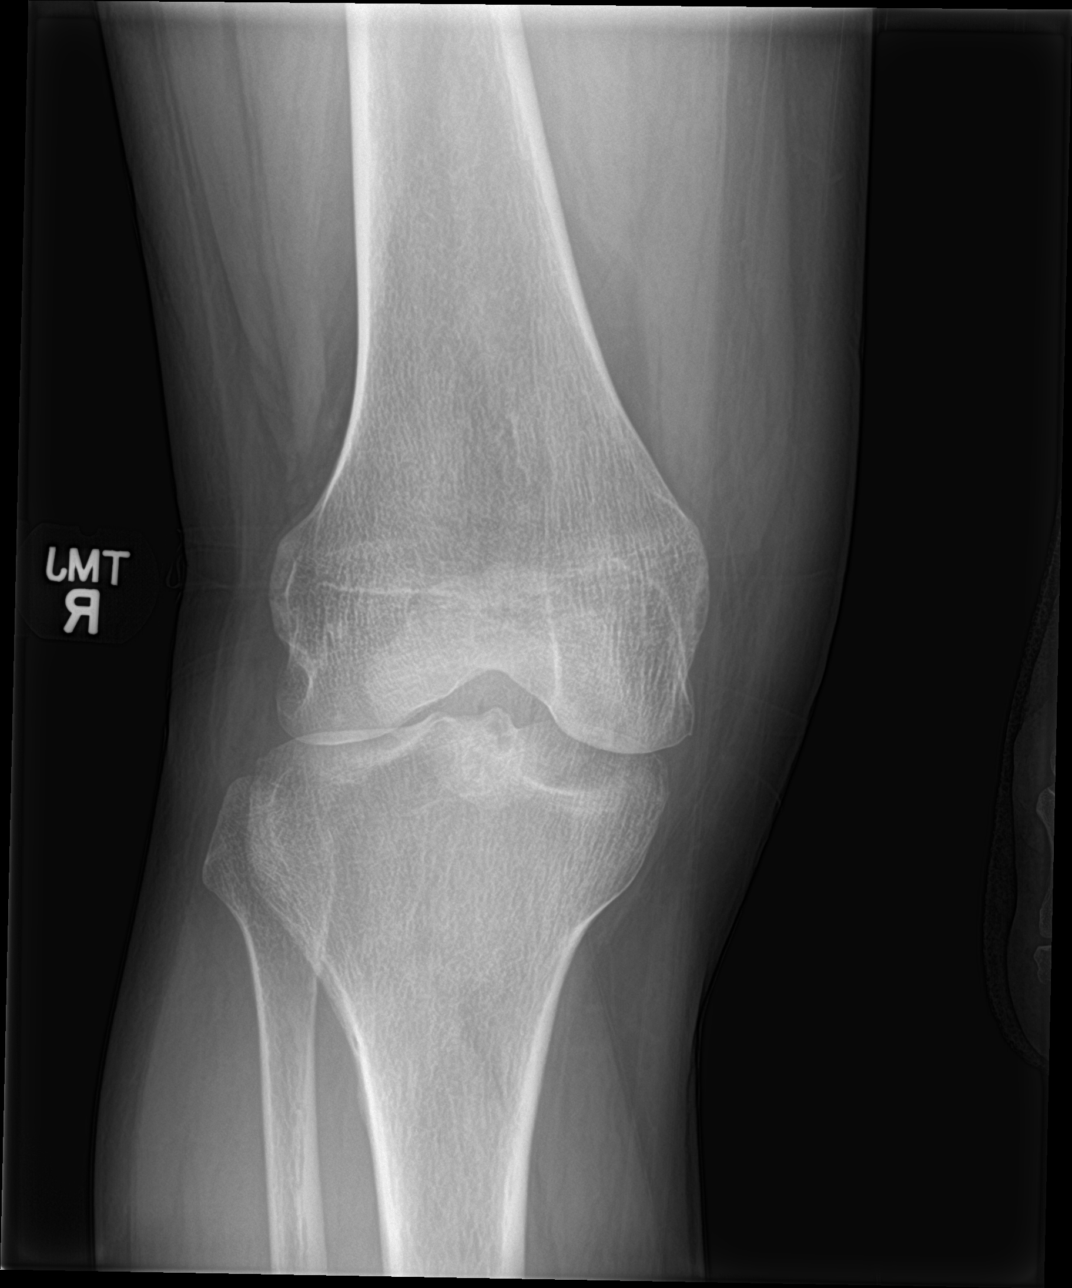

[knee lat]
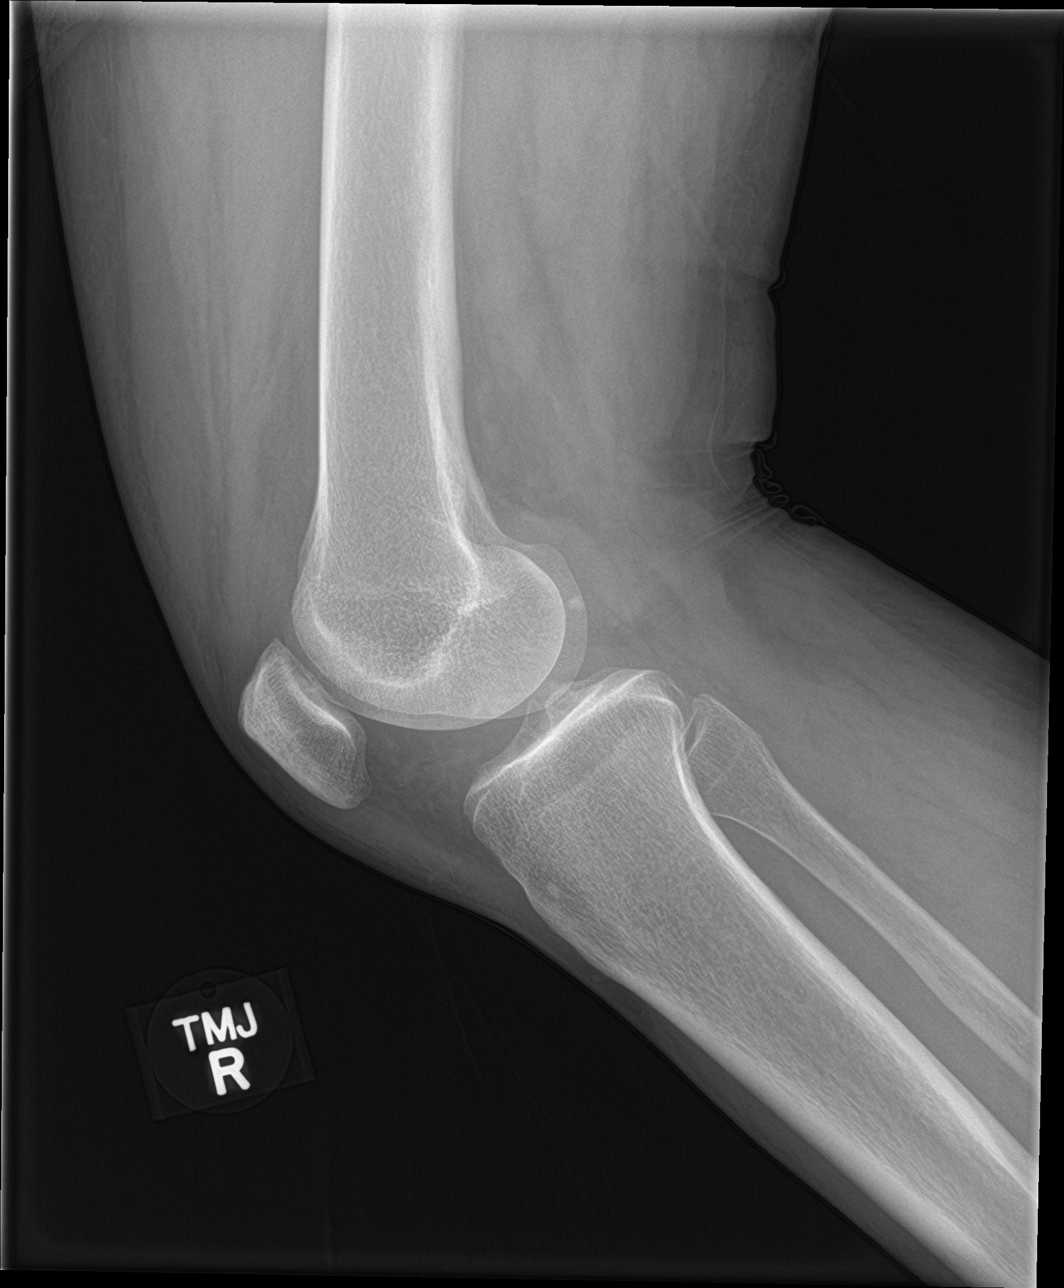

[knee obl (1 of 2)]
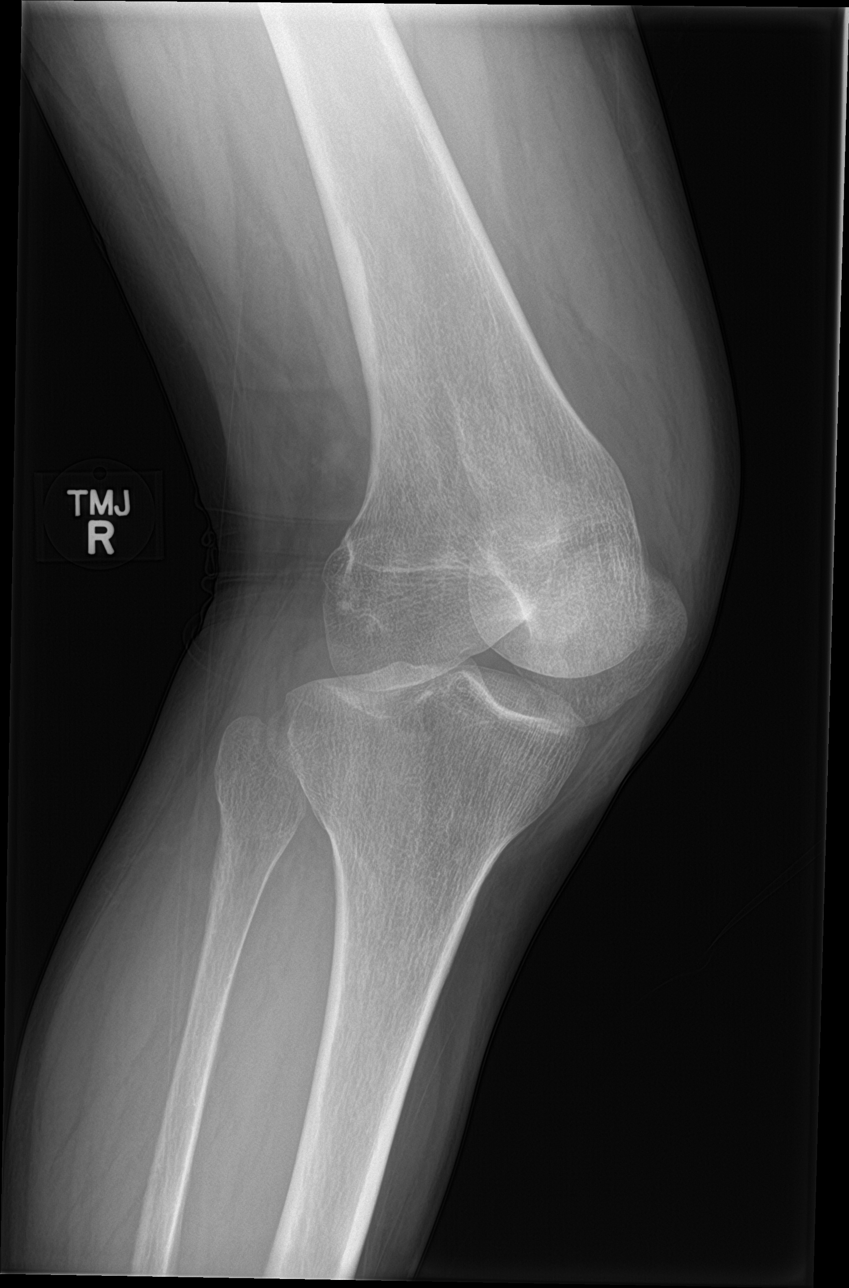

[knee obl (2 of 2)]
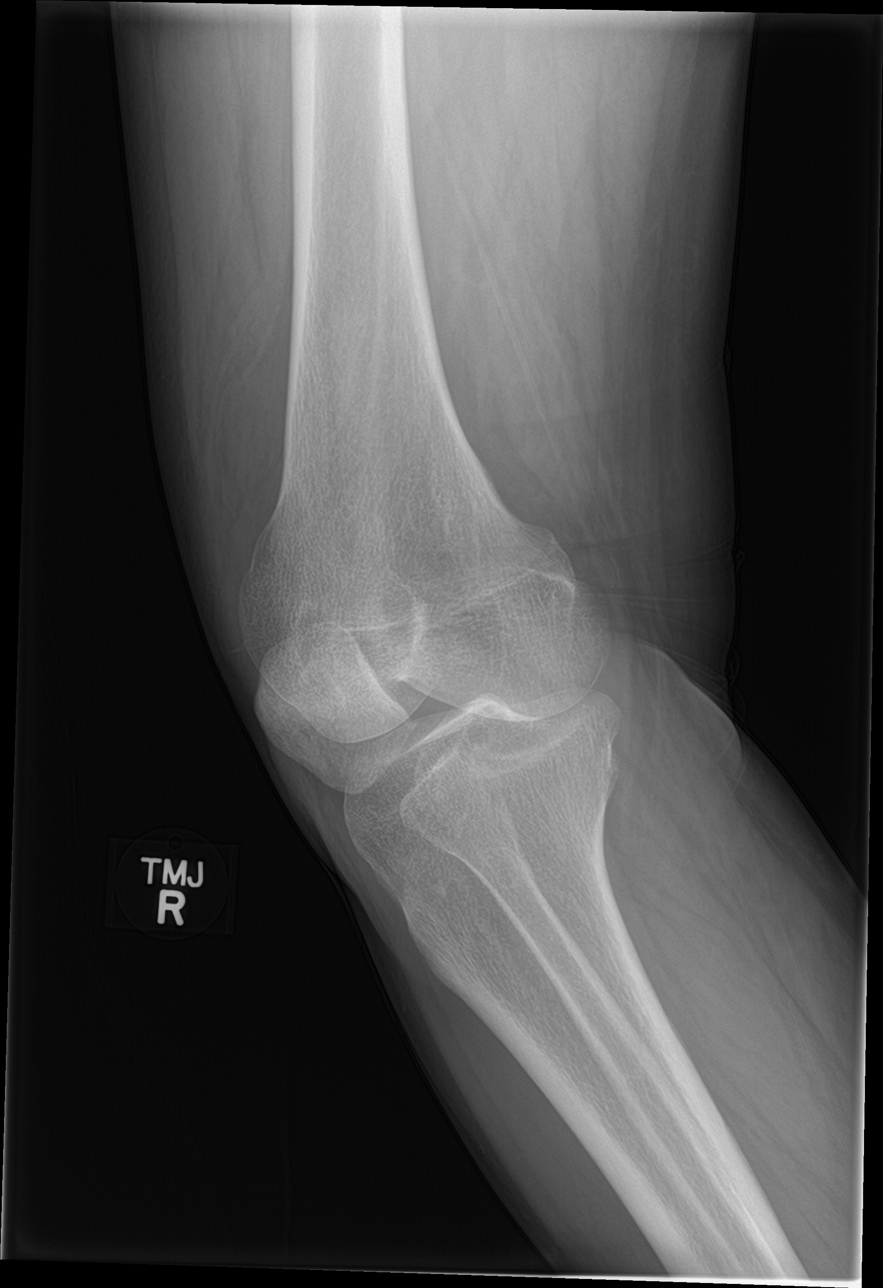

[4 of 4 positions shown; findings below may reference images not displayed]

FINDINGS: There is mild decreased bone mineralization. No evidence of fracture
or dislocation. No significant joint effusion. No significant
degenerative changes.
IMPRESSION: Negative.

## 2018-02-18 ENCOUNTER — Ambulatory Visit (INDEPENDENT_AMBULATORY_CARE_PROVIDER_SITE_OTHER): Payer: BC Managed Care – PPO | Admitting: Physician Assistant

## 2018-02-18 ENCOUNTER — Other Ambulatory Visit: Payer: Self-pay

## 2018-02-18 ENCOUNTER — Encounter: Payer: Self-pay | Admitting: Physician Assistant

## 2018-02-18 VITALS — BP 112/80 | HR 71 | Temp 98.9°F | Resp 18 | Ht 66.25 in | Wt 154.8 lb

## 2018-02-18 DIAGNOSIS — J069 Acute upper respiratory infection, unspecified: Secondary | ICD-10-CM | POA: Diagnosis not present

## 2018-02-18 MED ORDER — AZITHROMYCIN 250 MG PO TABS: ORAL_TABLET | ORAL | 0 refills | Status: AC

## 2018-02-18 NOTE — Patient Instructions (Addendum)
Start Z-Pak and I would recommend taking 1-2 Aleve in the morning along with 1-2 Aleve at night.  At any point you develop worsening fever, chills, shortness of breath then please come back.    IF you received an x-ray today, you will receive an invoice from [facility]. Please contact [facility] at [phone] with questions or concerns regarding your invoice.   IF you received labwork today, you will receive an invoice from [place]. Please contact [facility] at [phone] with questions or concerns regarding your invoice.   Our billing staff will not be able to assist you with questions regarding bills from these companies.  You will be contacted with the lab results as soon as they are available. The fastest way to get your results is to activate your My Chart account. Instructions are located on the last page of this paperwork. If you have not heard from [place] regarding the results in 2 weeks, please contact this office.

## 2018-02-18 NOTE — Progress Notes (Signed)
02/18/2018 8:50 AM   DOB: 12-21-1955 / MRN: [id number]  SUBJECTIVE:  Mary Humphrey is a 62 y.o. female presenting for nasal congestion. Symptoms present for 1 month.  The problem is waxing and waning but worse recently. She has tried several OTC therapies.  She has a history of allergies, asthma, eczema and is prescribed several antihistamines.  She is allergic to doxycycline.   She  has a past medical history of Allergy, Asthma, Environmental and seasonal allergies, and Sinusitis.    She  reports that she has never smoked. She has never used smokeless tobacco. She reports that she drinks alcohol. She reports that she does not use drugs. She  reports that she currently engages in sexual activity. She reports using the following method of birth control/protection: Post-menopausal. The patient  has a past surgical history that includes Sinus endo w/fusion (Bilateral, 09/09/2014) and Knee cartilage surgery.  Her family history includes Asthma in her sister; Heart disease in her father.  ROS per HPI  The problem list and medications were reviewed and updated by myself where necessary and exist elsewhere in the encounter.   OBJECTIVE:  BP 112/80   Pulse 71   Temp 98.9 F (37.2 C) (Oral)   Resp 18   Ht 5' 6.25" (1.683 m)   Wt 154 lb 12.8 oz (70.2 kg)   SpO2 98%   BMI 24.80 kg/m   Wt Readings from Last 3 Encounters:  02/18/18 154 lb 12.8 oz (70.2 kg)  01/19/17 160 lb 9.6 oz (72.8 kg)  06/21/16 162 lb 6.4 oz (73.7 kg)   Temp Readings from Last 3 Encounters:  02/18/18 98.9 F (37.2 C) (Oral)  01/19/17 97.4 F (36.3 C) (Oral)  07/08/16 97.7 F (36.5 C) (Oral)   BP Readings from Last 3 Encounters:  02/18/18 112/80  01/19/17 (!) 142/94  07/08/16 156/78   Pulse Readings from Last 3 Encounters:  02/18/18 71  01/19/17 62  07/08/16 77    Physical Exam  Constitutional: She is oriented to person, place, and time. She appears well-nourished. No distress.  HENT:  Right Ear:  Hearing, tympanic membrane, external ear and ear canal normal.  Left Ear: Hearing, tympanic membrane, external ear and ear canal normal.  Nose: Nose normal. Right sinus exhibits no maxillary sinus tenderness and no frontal sinus tenderness. Left sinus exhibits no maxillary sinus tenderness and no frontal sinus tenderness.  Mouth/Throat: Uvula is midline and mucous membranes are normal. Mucous membranes are not dry. Oropharyngeal exudate and posterior oropharyngeal erythema present. No posterior oropharyngeal edema or tonsillar abscesses.  Eyes: Pupils are equal, round, and reactive to light. EOM are normal.  Cardiovascular: Normal rate, regular rhythm, S1 normal, S2 normal, normal heart sounds and intact distal pulses. Exam reveals no gallop, no friction rub and no decreased pulses.  No murmur heard. Pulmonary/Chest: Effort normal. No stridor. No respiratory distress. She has no wheezes. She has no rales.  Abdominal: She exhibits no distension.  Musculoskeletal: She exhibits no edema.  Lymphadenopathy:       Head (right side): No submandibular and no tonsillar adenopathy present.       Head (left side): Tonsillar adenopathy present. No submandibular adenopathy present.    She has no cervical adenopathy.       Right cervical: No superficial cervical, no deep cervical and no posterior cervical adenopathy present.      Left cervical: No superficial cervical, no deep cervical and no posterior cervical adenopathy present.  Neurological: She is alert and  oriented to person, place, and time. No cranial nerve deficit. Gait normal.  Skin: Skin is dry. She is not diaphoretic.  Psychiatric: She has a normal mood and affect.  Vitals reviewed.    Lab Results  Component Value Date   WBC 7.2 01/19/2017   HGB 13.7 01/19/2017   HCT 41.5 01/19/2017   MCV 91 01/19/2017   PLT 289 01/19/2017    Lab Results  Component Value Date   CREATININE 0.80 01/19/2017   BUN 14 01/19/2017   NA 142 01/19/2017   K  4.1 01/19/2017   CL 102 01/19/2017   CO2 24 01/19/2017    Lab Results  Component Value Date   ALT 17 01/19/2017   AST 16 01/19/2017   ALKPHOS 70 01/19/2017   BILITOT 1.2 01/19/2017     Lab Results  Component Value Date   TSH 1.130 01/19/2017    Lab Results  Component Value Date   CHOL 189 01/19/2017   HDL 52 01/19/2017   LDLCALC 115 (H) 01/19/2017   TRIG 111 01/19/2017   CHOLHDL 3.6 01/19/2017     ASSESSMENT AND PLAN:  Mary Humphrey was seen today for cough.  Diagnoses and all orders for this visit:  Protracted URI Comments: New problem requiring prescription medication.  Patient with double sickening.  Her throat is erythematous and I can see some mild exudate.  She has some lymphadenopathy about the left side.  Start her on a Z-Pak to cover for strep.  Advise she take Aleve twice daily per AVS.    The patient is advised to call or return to clinic if she does not see an improvement in symptoms, or to seek the care of the closest emergency department if she worsens with the above plan.   Philis Fendt, MHS, PA-C [facility] 02/18/2018 8:50 AM

## 2018-02-18 NOTE — Addendum Note (Signed)
Addended by: Tereasa Coop on: 02/18/2018 10:14 AM   Modules accepted: Orders

## 2018-05-24 ENCOUNTER — Other Ambulatory Visit: Payer: Self-pay | Admitting: Physician Assistant

## 2018-05-24 DIAGNOSIS — Z1231 Encounter for screening mammogram for malignant neoplasm of breast: Secondary | ICD-10-CM

## 2018-06-29 ENCOUNTER — Ambulatory Visit
Admission: RE | Admit: 2018-06-29 | Discharge: 2018-06-29 | Disposition: A | Discharge status: Home / Self Care | Payer: BC Managed Care – PPO | Source: Ambulatory Visit | Attending: Physician Assistant | Admitting: Physician Assistant

## 2018-06-29 DIAGNOSIS — Z1231 Encounter for screening mammogram for malignant neoplasm of breast: Secondary | ICD-10-CM

## 2019-06-01 ENCOUNTER — Telehealth (INDEPENDENT_AMBULATORY_CARE_PROVIDER_SITE_OTHER): Payer: BC Managed Care – PPO | Admitting: Family Medicine

## 2019-06-01 ENCOUNTER — Other Ambulatory Visit: Payer: Self-pay

## 2019-06-01 DIAGNOSIS — H1010 Acute atopic conjunctivitis, unspecified eye: Secondary | ICD-10-CM

## 2019-06-01 DIAGNOSIS — J019 Acute sinusitis, unspecified: Secondary | ICD-10-CM | POA: Diagnosis not present

## 2019-06-01 DIAGNOSIS — J309 Allergic rhinitis, unspecified: Secondary | ICD-10-CM

## 2019-06-01 MED ORDER — AZITHROMYCIN 250 MG PO TABS: ORAL_TABLET | ORAL | 0 refills | Status: DC

## 2019-06-01 MED ORDER — PREDNISONE 20 MG PO TABS: 40.0000 mg | ORAL_TABLET | Freq: Every day | ORAL | 0 refills | Status: DC

## 2019-06-01 MED ORDER — AZELASTINE HCL 0.05 % OP SOLN: 1.0000 [drp] | Freq: Two times a day (BID) | OPHTHALMIC | 3 refills | Status: DC

## 2019-06-01 NOTE — Progress Notes (Signed)
Virtual Visit via Video Note  I connected with Mary Humphrey on 06/01/19 at 8:50 AM by a video enabled telemedicine application and verified that I am speaking with the correct person using two identifiers.   I discussed the limitations, risks, security and privacy concerns of performing an evaluation and management service by telephone and the availability of in person appointments. I also discussed with the patient that there may be a patient responsible charge related to this service. The patient expressed understanding and agreed to proceed, consent obtained  Chief complaint:  Cold symptoms  History of Present Illness: Mary Humphrey is a 63 y.o. female  Congestion, watery eyes, nasal congestion approximately 2 weeks.  No fever.  Has had temp checked multiple times.  Minimal cough - initial cough has subsided.  Sick contacts - grandchild with cold sx's 2 weekends ago, then her symptoms started.  No change in taste/smell. Chronic anosmia with nasal issues. No bodyache/headache. Runny eyes, itching, sneezing.  Yellow- green nasal congestion for past 2 weeks. No sinus/face pain. Slight upper tooth pain initially, but better.   Has had stomach issues with abx in past.  Has tolerated prednisone prior.   Tx: dayquil, mucinex. Helping somewhat, then congestion returns.  Saline NS BID.  Zyrtec, singulair, xhance nasal spray - fluticasone BID, rx by allergist. Hx of nasal polyps  Asthma doing ok.     History of asthma and allergies. Patient Active Problem List   Diagnosis Date Noted  . History of colonic polyps 07/09/2014  . 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 Asthma 08/18/2012  . Allergic rhinitis 08/18/2012   Past Medical History:  Diagnosis Date  . Allergy   . Asthma   . Environmental and seasonal allergies   . Sinusitis    Past Surgical History:  Procedure Laterality Date  . KNEE CARTILAGE SURGERY     Meniscus  . SINUS ENDO W/FUSION Bilateral 09/09/2014   Procedure: BILATERAL  ENDOSCOPIC TOTAL ETHMOIDECTOMY/BILATERAL ENDOSCOPIC MAXILLARY ANTROSTOMY/BILATERAL ENDOSCOPIC FRONTAL RECESS EXPLORATION/BILATERAL ENDOSCOPIC SPHENOIDECTOMY WITH FUSION NAVIGATION;  Surgeon: Ascencion Dike, MD;  Location: [facility];  Service: ENT;  Laterality: Bilateral;   Allergies  Allergen Reactions  . Doxycycline Other (See Comments)    Gi UPSET-DIARRHEAQ   Prior to Admission medications   Medication Sig Start Date End Date Taking? Authorizing Provider  albuterol (PROVENTIL HFA;VENTOLIN HFA) 108 (90 BASE) MCG/ACT inhaler Inhale into the lungs every 6 (six) hours as needed for wheezing or shortness of breath.   Yes [provider]  azelastine (ASTELIN) 0.1 % nasal spray Place into both nostrils 2 (two) times daily. Use in each nostril as directed   Yes [provider]  BREO ELLIPTA 100-25 MCG/INH AEPB INL 1 PUFF PO QD 12/20/16  Yes [provider]  cetirizine (ZYRTEC) 10 MG tablet Take 10 mg by mouth daily.   Yes [provider]  desoximetasone (TOPICORT) 0.05 % cream APPLY TOPICALLY TO THE AFFECTED AREA BID PRF ECZEMA FLARE 01/07/17  Yes [provider]  EPINEPHrine 0.3 mg/0.3 mL IJ SOAJ injection Inject into the muscle once.   Yes [provider]  Fluticasone-Salmeterol (ADVAIR) 100-50 MCG/DOSE AEPB Inhale 1 puff into the lungs every 12 (twelve) hours. 08/18/12  Yes Wardell Honour, MD  hydrOXYzine (ATARAX/VISTARIL) 25 MG tablet Take 0.5-1 pill as needed for anxiety. 01/19/17  Yes Timmothy Euler, [place] D, PA-C  Montelukast Sodium (SINGULAIR PO) Take by mouth.   Yes [provider]  UNABLE TO FIND Med Name: allergy shots weekly  Yes [provider]   Social History   Socioeconomic History  . Marital status: Married    Spouse name: Not on file  . Number of children: Not on file  . Years of education: Not on file  . Highest education level: Not on file  Occupational History  . Occupation: Oncologist: [employer]  Social Needs  . Financial resource strain: Not on file  . Food insecurity    Worry: Not on file    Inability: Not on file  . Transportation needs    Medical: Not on file    Non-medical: Not on file  Tobacco Use  . Smoking status: Never Smoker  . Smokeless tobacco: Never Used  Substance and Sexual Activity  . Alcohol use: Yes    Alcohol/week: 0.0 standard drinks    Comment: 2-3 drinks-socially  . Drug use: No  . Sexual activity: Yes    Birth control/protection: Post-menopausal  Lifestyle  . Physical activity    Days per week: Not on file    Minutes per session: Not on file  . Stress: Not on file  Relationships  . Social Herbalist on phone: Not on file    Gets together: Not on file    Attends religious service: Not on file    Active member of club or organization: Not on file    Attends meetings of clubs or organizations: Not on file    Relationship status: Not on file  . Intimate partner violence    Fear of current or ex partner: Not on file    Emotionally abused: Not on file    Physically abused: Not on file    Forced sexual activity: Not on file  Other Topics Concern  . Not on file  Social History Narrative   Marital status:  Married x 33 years      Children: two children; no grandchildren      Lives: with husband      Employment:  Surveyor, quantity at [employer]     Tobacco: none      Alcohol:  Socially three times weekly      Exercise: none    Observations/Objective: Over video no distress, speaking in full sentences.  Nasal nature to voice but no respiratory distress.  Nontoxic appearance. There were no vitals filed for this visit.   Assessment and Plan: Allergic rhinitis, unspecified seasonality, unspecified trigger - Plan: predniSONE (DELTASONE) 20 MG tablet  Acute sinusitis, recurrence not specified, unspecified location - Plan: predniSONE (DELTASONE) 20 MG tablet, azithromycin (ZITHROMAX) 250 MG tablet  Allergic  conjunctivitis, unspecified laterality - Plan: azelastine (OPTIVAR) 0.05 % ophthalmic solution  Suspected combination of persistent allergic rhinitis with secondary sinusitis.  Persistent discolored nasal discharge, initial tooth pain, some improvement.  -Continue steroid nasal spray, other allergy treatments, increase saline nasal spray to 4 times per day for now, add Optivar for ophthalmic symptoms.  -Prednisone short course, 40 mg daily x3 days, side effects discussed, tolerated previously  -Azithromycin, Z-Pak, tolerated for sinusitis in the past.  Deferred Augmentin at this time given gastrointestinal issues previously and milder symptoms at present.  RTC precautions given.   Follow Up Instructions: As needed   I discussed the assessment and treatment plan with the patient. The patient was provided an opportunity to ask questions and all were answered. The patient agreed with the plan and demonstrated an understanding of the instructions.   The patient was advised to call back or  seek an in-person evaluation if the symptoms worsen or if the condition fails to improve as anticipated.  I provided 16 minutes of non-face-to-face time during this encounter.   Wendie Agreste, MD

## 2019-06-01 NOTE — Progress Notes (Signed)
CC- Cold- little cough, very congested, watery eye, stuffy nose. No fever and no sore throat. This has been going on for 2 weeks now.

## 2019-06-21 ENCOUNTER — Encounter: Payer: Self-pay | Admitting: Internal Medicine

## 2019-09-29 ENCOUNTER — Ambulatory Visit: Payer: BC Managed Care – PPO

## 2019-10-02 ENCOUNTER — Other Ambulatory Visit: Payer: Self-pay | Admitting: Internal Medicine

## 2019-10-02 DIAGNOSIS — Z1231 Encounter for screening mammogram for malignant neoplasm of breast: Secondary | ICD-10-CM

## 2019-11-12 ENCOUNTER — Other Ambulatory Visit: Payer: Self-pay

## 2019-11-12 ENCOUNTER — Ambulatory Visit
Admission: RE | Admit: 2019-11-12 | Discharge: 2019-11-12 | Disposition: A | Discharge status: Home / Self Care | Payer: BC Managed Care – PPO | Source: Ambulatory Visit | Attending: Internal Medicine | Admitting: Internal Medicine

## 2019-11-12 DIAGNOSIS — Z1231 Encounter for screening mammogram for malignant neoplasm of breast: Secondary | ICD-10-CM

## 2020-01-24 ENCOUNTER — Encounter: Payer: Self-pay | Admitting: Internal Medicine

## 2020-01-31 ENCOUNTER — Other Ambulatory Visit: Payer: Self-pay | Admitting: Family Medicine

## 2020-01-31 DIAGNOSIS — E2839 Other primary ovarian failure: Secondary | ICD-10-CM

## 2020-03-06 ENCOUNTER — Ambulatory Visit (AMBULATORY_SURGERY_CENTER): Payer: Self-pay

## 2020-03-06 ENCOUNTER — Other Ambulatory Visit: Payer: Self-pay

## 2020-03-06 VITALS — Ht 66.25 in | Wt 157.0 lb

## 2020-03-06 DIAGNOSIS — Z86010 Personal history of colonic polyps: Secondary | ICD-10-CM

## 2020-03-06 MED ORDER — CLENPIQ 10-3.5-12 MG-GM -GM/160ML PO SOLN: 1.0000 | Freq: Once | ORAL | 0 refills | Status: AC

## 2020-03-06 NOTE — Progress Notes (Signed)
No egg or soy allergy known to patient  No issues with past sedation with any surgeries or procedures no intubation problems in the past  No diet pills per patient No home 02 use per patient  No blood thinners per patient  Pt denies issues with constipation  No A fib or A flutter  EMMI video to pt or MyChart  COVID 19 guidelines implemented in [facility] today   Pt has been vaccinated for covid.  Pt did not feel she could swallow the pills for sutab, Clenpiq prep entered.  Clenpiq Coupon given to pt in [facility] today   Due to the COVID-19 pandemic we are asking patients to follow these guidelines. Please only bring one care partner. Please be aware that your care partner may wait in the car in the parking lot or if they feel like they will be too hot to wait in the car, they may wait in the lobby on the 4th floor. All care partners are required to wear a mask the entire time (we do not have any that we can provide them), they need to practice social distancing, and we will do a Covid check for all patient's and care partners when you arrive. Also we will check their temperature and your temperature. If the care partner waits in their car they need to stay in the parking lot the entire time and we will call them on their cell phone when the patient is ready for discharge so they can bring the car to the front of the building. Also all patient's will need to wear a mask into building.

## 2020-03-07 ENCOUNTER — Encounter: Payer: Self-pay | Admitting: Internal Medicine

## 2020-03-17 ENCOUNTER — Telehealth: Payer: Self-pay | Admitting: Internal Medicine

## 2020-03-17 NOTE — Telephone Encounter (Signed)
Hey Dr Henrene Pastor, pt cancelled her procedure scheduled for 8/5 due to her lack of transportation, she will call back to rsx as soon as she finds a ride.

## 2020-03-20 ENCOUNTER — Encounter: Payer: BC Managed Care – PPO | Admitting: Internal Medicine

## 2020-04-22 ENCOUNTER — Other Ambulatory Visit: Payer: Self-pay

## 2020-04-22 ENCOUNTER — Ambulatory Visit
Admission: RE | Admit: 2020-04-22 | Discharge: 2020-04-22 | Disposition: A | Discharge status: Home / Self Care | Payer: BC Managed Care – PPO | Source: Ambulatory Visit | Attending: Family Medicine | Admitting: Family Medicine

## 2020-04-22 DIAGNOSIS — E2839 Other primary ovarian failure: Secondary | ICD-10-CM

## 2020-09-29 ENCOUNTER — Other Ambulatory Visit: Payer: Self-pay | Admitting: Family Medicine

## 2020-09-29 ENCOUNTER — Other Ambulatory Visit: Payer: Self-pay | Admitting: Internal Medicine

## 2020-09-29 DIAGNOSIS — Z1231 Encounter for screening mammogram for malignant neoplasm of breast: Secondary | ICD-10-CM

## 2020-10-03 ENCOUNTER — Other Ambulatory Visit: Payer: Self-pay | Admitting: Family Medicine

## 2020-10-03 ENCOUNTER — Other Ambulatory Visit: Payer: Self-pay

## 2020-10-03 ENCOUNTER — Ambulatory Visit
Admission: RE | Admit: 2020-10-03 | Discharge: 2020-10-03 | Disposition: A | Discharge status: Home / Self Care | Payer: BC Managed Care – PPO | Source: Ambulatory Visit | Attending: Family Medicine | Admitting: Family Medicine

## 2020-10-03 DIAGNOSIS — M5412 Radiculopathy, cervical region: Secondary | ICD-10-CM

## 2020-10-14 ENCOUNTER — Other Ambulatory Visit (HOSPITAL_COMMUNITY): Payer: Self-pay | Admitting: Family Medicine

## 2020-10-14 DIAGNOSIS — I6529 Occlusion and stenosis of unspecified carotid artery: Secondary | ICD-10-CM

## 2020-10-21 ENCOUNTER — Other Ambulatory Visit: Payer: Self-pay

## 2020-10-21 ENCOUNTER — Ambulatory Visit (HOSPITAL_COMMUNITY)
Admission: RE | Admit: 2020-10-21 | Discharge: 2020-10-21 | Disposition: A | Discharge status: Home / Self Care | Payer: BC Managed Care – PPO | Source: Ambulatory Visit | Attending: Family Medicine | Admitting: Family Medicine

## 2020-10-21 DIAGNOSIS — I6529 Occlusion and stenosis of unspecified carotid artery: Secondary | ICD-10-CM | POA: Diagnosis present

## 2020-10-21 NOTE — Progress Notes (Signed)
Carotid US completed    Please see CV Proc for preliminary results.   Kani Chauvin, RVT  

## 2020-11-10 ENCOUNTER — Encounter: Payer: Self-pay | Admitting: Neurology

## 2020-11-19 ENCOUNTER — Ambulatory Visit: Payer: BC Managed Care – PPO

## 2021-01-26 ENCOUNTER — Ambulatory Visit
Admission: RE | Admit: 2021-01-26 | Discharge: 2021-01-26 | Disposition: A | Discharge status: Home / Self Care | Payer: BC Managed Care – PPO | Source: Ambulatory Visit | Attending: Family Medicine | Admitting: Family Medicine

## 2021-01-26 ENCOUNTER — Other Ambulatory Visit: Payer: Self-pay

## 2021-01-26 DIAGNOSIS — Z1231 Encounter for screening mammogram for malignant neoplasm of breast: Secondary | ICD-10-CM

## 2021-02-06 ENCOUNTER — Ambulatory Visit: Payer: BC Managed Care – PPO | Admitting: Neurology

## 2021-02-09 ENCOUNTER — Other Ambulatory Visit: Payer: Self-pay

## 2021-02-09 ENCOUNTER — Encounter: Payer: Self-pay | Admitting: Neurology

## 2021-02-09 ENCOUNTER — Ambulatory Visit: Payer: BC Managed Care – PPO | Admitting: Neurology

## 2021-02-09 VITALS — BP 133/86 | HR 72 | Ht 67.0 in | Wt 166.0 lb

## 2021-02-09 DIAGNOSIS — R29898 Other symptoms and signs involving the musculoskeletal system: Secondary | ICD-10-CM

## 2021-02-09 DIAGNOSIS — R258 Other abnormal involuntary movements: Secondary | ICD-10-CM | POA: Diagnosis not present

## 2021-02-09 DIAGNOSIS — R292 Abnormal reflex: Secondary | ICD-10-CM | POA: Diagnosis not present

## 2021-02-09 NOTE — Patient Instructions (Addendum)
We will order MRI brain without contrast  We will call you with the results and let you know whether we will offer a trial of the medication (sinemet)

## 2021-02-09 NOTE — Progress Notes (Signed)
Mary Humphrey   Date: 02/09/21  Mary Humphrey MRN: [id number] DOB: August 03, 1956   Dear Dr. Darron Doom:   Thank you for your kind referral of Mary Humphrey for consultation of right hand clumsiness. Although her history is well known to you, please allow [place] to reiterate it for the purpose of our medical record. The patient was accompanied to the clinic by self.    History of Present Illness: Mary Humphrey is a 65 y.o. right-handed female with allergies presenting for evaluation of right hand difficulty.   Starting around 2019, she began noticing that her right hand felt more clumsy and movements were not the same, as compared to the left.  Over the past year, her ability to grip is weaker and she has difficulty writing or painting with her grandchildren.  Fine finger movements, such as applying makeup, is difficult. Small buttons are tricky.  She denies tremor or stiffness. She denies numbness, tingling, or neck pain.  MRI cervical spine from March 2022 shows mild degenerative changes at C4-5 and C5-6 causing mild canal stenosis.  No RLS, constipation, or lack of smell.  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    Out-side paper records, electronic medical record, and images have been reviewed where available and summarized as:  MRI cervical spine wo contrast 10/24/2020:  Cervical degenerative disc disease most significant at C4-5 and C5-6 with discosteophyte causing mild canal stenosis   Lab Results  Component Value Date   TSH 1.130 01/19/2017   No results found for: ESRSEDRATE, POCTSEDRATE  Past Medical History:  Diagnosis Date   Allergy    environmental and seasonal   Asthma    Eczema 2019   Environmental and seasonal allergies    Sinusitis     Past Surgical History:  Procedure Laterality Date   COLONOSCOPY  06/24/2014   SSP and TA JP   KNEE CARTILAGE SURGERY     Meniscus   SINUS ENDO W/FUSION Bilateral 09/09/2014    Procedure: BILATERAL ENDOSCOPIC TOTAL ETHMOIDECTOMY/BILATERAL ENDOSCOPIC MAXILLARY ANTROSTOMY/BILATERAL ENDOSCOPIC FRONTAL RECESS EXPLORATION/BILATERAL ENDOSCOPIC SPHENOIDECTOMY WITH FUSION NAVIGATION;  Surgeon: Ascencion Dike, MD;  Location: [facility];  Service: ENT;  Laterality: Bilateral;     Medications:  Outpatient Encounter Medications as of 02/09/2021  Medication Sig Note   azelastine (OPTIVAR) 0.05 % ophthalmic solution Place 1 drop into both eyes 2 (two) times daily.    BREO ELLIPTA 100-25 MCG/INH AEPB INL 1 PUFF PO QD    cetirizine (ZYRTEC) 10 MG tablet Take 1 tablet by mouth daily as needed.    desoximetasone (TOPICORT) 0.05 % cream APPLY TOPICALLY TO THE AFFECTED AREA BID PRF ECZEMA FLARE    DUPIXENT 300 MG/2ML prefilled syringe     EPINEPHrine 0.3 mg/0.3 mL IJ SOAJ injection Inject into the muscle once. 09/09/2014: Has on hand but never used    EPINEPHrine 0.3 mg/0.3 mL IJ SOAJ injection Inject into the muscle.    Fluticasone-Salmeterol (ADVAIR) 100-50 MCG/DOSE AEPB Inhale 1 puff into the lungs every 12 (twelve) hours.    cetirizine (ZYRTEC) 10 MG tablet Take 10 mg by mouth daily. (Patient not taking: Reported on 02/09/2021)    [DISCONTINUED] albuterol (PROVENTIL HFA;VENTOLIN HFA) 108 (90 BASE) MCG/ACT inhaler Inhale into the lungs every 6 (six) hours as needed for wheezing or shortness of breath. (Patient not taking: Reported on 02/09/2021) 09/09/2014: Has never used but has    [DISCONTINUED] azelastine (ASTELIN) 0.1 % nasal spray Place into both nostrils  2 (two) times daily. Use in each nostril as directed (Patient not taking: Reported on 02/09/2021) 04/17/2014: prn   [DISCONTINUED] azithromycin (ZITHROMAX) 250 MG tablet Take 2 pills by mouth on day 1, then 1 pill by mouth per day on days 2 through 5. (Patient not taking: Reported on 02/09/2021)    [DISCONTINUED] hydrOXYzine (ATARAX/VISTARIL) 25 MG tablet Take 0.5-1 pill as needed for anxiety. (Patient not taking: Reported on  02/09/2021)    [DISCONTINUED] montelukast (SINGULAIR) 10 MG tablet Take 1 tablet by mouth every evening. (Patient not taking: Reported on 02/09/2021)    [DISCONTINUED] Montelukast Sodium (SINGULAIR PO) Take by mouth. (Patient not taking: Reported on 02/09/2021)    [DISCONTINUED] predniSONE (DELTASONE) 20 MG tablet Take 2 tablets (40 mg total) by mouth daily with breakfast. (Patient not taking: Reported on 02/09/2021)    [DISCONTINUED] UNABLE TO FIND Med Name: allergy shots weekly (Patient not taking: Reported on 02/09/2021)    No facility-administered encounter medications on file as of 02/09/2021.    Allergies:  Allergies  Allergen Reactions   Doxycycline Other (See Comments)    Gi UPSET-DIARRHEAQ    Family History: Family History  Problem Relation Age of Onset   Asthma Sister    Heart disease Father    Colon cancer Neg Hx    Rectal cancer Neg Hx    Stomach cancer Neg Hx    Colon polyps Neg Hx    Esophageal cancer Neg Hx     Social History: Social History   Tobacco Use   Smoking status: Never   Smokeless tobacco: Never  Vaping Use   Vaping Use: Never used  Substance Use Topics   Alcohol use: Yes    Alcohol/week: 0.0 standard drinks    Comment: 2-3 drinks-socially   Drug use: No   Social History   Social History Narrative   Marital status:  Married x 33 years   Children: two children; no grandchildren   Lives: with husband   Employment:  Surveyor, quantity at [employer]  Tobacco: none   Alcohol:  Socially three times weekly   Exercise: none      Right Handed    Lives in a two story home    Vital Signs:  BP 133/86   Pulse 72   Ht 5\' 7"  (1.702 m)   Wt 166 lb (75.3 kg)   SpO2 97%   BMI 26.00 kg/m    Neurological Exam: MENTAL STATUS including orientation to time, place, person, recent and remote memory, attention span and concentration, language, and fund of knowledge is normal.  Speech is not dysarthric.  CRANIAL NERVES: II:  No visual field defects.   III-IV-VI:  Pupils equal round and reactive to light.  Normal conjugate, extra-ocular eye movements in all directions of gaze.  No nystagmus.  No ptosis.   V:  Normal facial sensation.    VII:  Normal facial symmetry and movements.   VIII:  Normal hearing and vestibular function.   IX-X:  Normal palatal movement.   XI:  Normal shoulder shrug and head rotation.   XII:  Normal tongue strength and range of motion, no deviation or fasciculation.  MOTOR:  Increased rigidity in the right arm > right leg. Tone in the left arm and leg is normal. No atrophy, fasciculations or abnormal movements.  Mild right pronator drift.   Upper Extremity:  Right  Left  Deltoid  5/5   5/5   Biceps  5/5   5/5   Triceps  5/5  5/5   Infraspinatus 5/5  5/5  Medial pectoralis 5/5  5/5  Wrist extensors  5/5   5/5   Wrist flexors  5/5   5/5   Finger extensors  5/5   5/5   Finger flexors  5/5   5/5   Dorsal interossei  5/5   5/5   Abductor pollicis  5/5   5/5   Tone (Ashworth scale)  1  0   Lower Extremity:  Right  Left  Hip flexors  5/5   5/5   Hip extensors  5/5   5/5   Adductor 5/5  5/5  Abductor 5/5  5/5  Knee flexors  5/5   5/5   Knee extensors  5/5   5/5   Dorsiflexors  5/5   5/5   Plantarflexors  5/5   5/5   Toe extensors  5/5   5/5   Toe flexors  5/5   5/5   Tone (Ashworth scale)  1  0   MSRs:  Right        Left                  brachioradialis 2+  2+  biceps 3+  2+  triceps 2+  2+  patellar 2+  2+  ankle jerk 2+  2+  Hoffman no  no  plantar response down  down   SENSORY:  Normal and symmetric perception of light touch, pinprick, vibration, and proprioception.  Romberg's sign absent.   COORDINATION/GAIT: Normal finger-to- nose-finger and heel-to-shin.  Prominent bradykinesia with finger tapping on the right with markedly reduced amplitude and rate.  Toe tapping is slowed on the right has compared to the left.  Gait narrow based and stable. Tandem and stressed gait intact.    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  No tremor or weakness is present.  MRI cervical spine personally viewed and does not show compressive pathology.  - MRI brain wo contrast to evaluate for structural changes  - Based on MRI, consider trial of sinemet and OT  - Going forward, consider DaT scan   Thank you for allowing me to participate in patient's care.  If I can answer any additional questions, I would be pleased to do so.    Sincerely,    Brittin Belnap K. Posey Pronto, DO

## 2021-02-21 ENCOUNTER — Other Ambulatory Visit: Payer: BC Managed Care – PPO

## 2021-03-19 ENCOUNTER — Ambulatory Visit
Admission: RE | Admit: 2021-03-19 | Discharge: 2021-03-19 | Disposition: A | Discharge status: Home / Self Care | Payer: BC Managed Care – PPO | Source: Ambulatory Visit | Attending: Neurology | Admitting: Neurology

## 2021-03-19 ENCOUNTER — Other Ambulatory Visit: Payer: Self-pay

## 2021-03-19 DIAGNOSIS — R258 Other abnormal involuntary movements: Secondary | ICD-10-CM

## 2021-03-19 DIAGNOSIS — R292 Abnormal reflex: Secondary | ICD-10-CM

## 2021-03-19 DIAGNOSIS — R29898 Other symptoms and signs involving the musculoskeletal system: Secondary | ICD-10-CM

## 2021-03-25 ENCOUNTER — Telehealth: Payer: Self-pay | Admitting: Neurology

## 2021-03-25 NOTE — Telephone Encounter (Signed)
Called and left a voice mail for patient to call back to scheduled a follow up appointment for results. Per Dr. Posey Pronto, offer this Friday, 03/27/21.

## 2021-03-27 ENCOUNTER — Encounter: Payer: Self-pay | Admitting: Neurology

## 2021-03-27 ENCOUNTER — Ambulatory Visit: Payer: BC Managed Care – PPO | Admitting: Neurology

## 2021-03-27 ENCOUNTER — Other Ambulatory Visit: Payer: Self-pay

## 2021-03-27 VITALS — BP 138/79 | Ht 67.0 in | Wt 166.8 lb

## 2021-03-27 DIAGNOSIS — R258 Other abnormal involuntary movements: Secondary | ICD-10-CM | POA: Diagnosis not present

## 2021-03-27 DIAGNOSIS — R251 Tremor, unspecified: Secondary | ICD-10-CM | POA: Diagnosis not present

## 2021-03-27 DIAGNOSIS — R292 Abnormal reflex: Secondary | ICD-10-CM

## 2021-03-27 DIAGNOSIS — R29898 Other symptoms and signs involving the musculoskeletal system: Secondary | ICD-10-CM | POA: Diagnosis not present

## 2021-03-27 NOTE — Progress Notes (Signed)
Follow-up Visit   Date: 03/27/21   Mary Humphrey MRN: [id number] DOB: [birth year]   Interim History: Mary Humphrey is a 65 y.o. right-handed Caucasian female  returning to the clinic for follow-up of right hand difficulty.  The patient was accompanied to the clinic by self.  History of present illness: Starting around 2019, she began noticing that her right hand felt more clumsy and movements were not the same, as compared to the left.  Over the past year, her ability to grip is weaker and she has difficulty writing or painting with her grandchildren.  Fine finger movements, such as applying makeup, is difficult. Small buttons are tricky.  She denies tremor or stiffness. She denies numbness, tingling, or neck pain.  MRI cervical spine from March 2022 shows mild degenerative changes at C4-5 and C5-6 causing mild canal stenosis.  No RLS, constipation, or lack of smell.  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   UPDATE 03/27/2021:  She is here for follow-up visit to discuss her MRI brain results. There has been no change in her right hand symptoms.   Medications:  Current Outpatient Medications on File Prior to Visit  Medication Sig Dispense Refill   cetirizine (ZYRTEC) 10 MG tablet Take 1 tablet by mouth daily as needed.     desoximetasone (TOPICORT) 0.05 % cream APPLY TOPICALLY TO THE AFFECTED AREA BID PRF ECZEMA FLARE  1   DUPIXENT 300 MG/2ML prefilled syringe      EPINEPHrine 0.3 mg/0.3 mL IJ SOAJ injection Inject into the muscle once.     EPINEPHrine 0.3 mg/0.3 mL IJ SOAJ injection Inject into the muscle.     Fluticasone-Salmeterol (ADVAIR) 100-50 MCG/DOSE AEPB Inhale 1 puff into the lungs every 12 (twelve) hours. 60 each 11   azelastine (OPTIVAR) 0.05 % ophthalmic solution Place 1 drop into both eyes 2 (two) times daily. (Patient not taking: Reported on 03/27/2021) 6 mL 3   BREO ELLIPTA 100-25 MCG/INH AEPB INL 1 PUFF PO QD  5   cetirizine (ZYRTEC) 10 MG tablet Take 10 mg by mouth  daily. (Patient not taking: No sig reported)     No current facility-administered medications on file prior to visit.    Allergies:  Allergies  Allergen Reactions   Doxycycline Other (See Comments)    Gi UPSET-DIARRHEAQ    Vital Signs:  BP 138/79   Ht '5\' 7"'$  (1.702 m)   Wt 166 lb 12.8 oz (75.7 kg)   SpO2 96%   BMI 26.12 kg/m     Neurological Exam: MENTAL STATUS including orientation to time, place, person, recent and remote memory, attention span and concentration, language, and fund of knowledge is normal.  Speech is not dysarthric.  CRANIAL NERVES:  No visual field defects.  Pupils equal round and reactive to light.  Normal conjugate, extra-ocular eye movements in all directions of gaze.  No ptosis.  Mildly blunted facies  MOTOR:  Motor strength is 5/5 in all extremities.  Right hand with mild rigidity.  Mild right pronator drift.   MSRs:                                           Right        Left brachioradialis 3+  2+  biceps 3+  2+  triceps 2+  2+  patellar 3+  2+  ankle jerk 2+  2+  SENSORY:  Intact to vibration throughout.  COORDINATION/GAIT:  Normal finger-to- nose-finger.  prominent bradykinesia with reduced amplitude and rate of finger tapping. Toe tapping is also slowed on the right. .Gait narrow based and stable.   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   There are a few small scattered T2/FLAIR hyperintense signal changes within the cerebral white matter, the largest within the left subinsular white matter measuring 5 mm. These signal changes are nonspecific, but most often secondary to chronic small vessel ischemia. Potential alternative considerations include sequela of a prior infectious/inflammatory process, sequela of chronic migraine headaches or  sequela of a demyelinating process (such as multiple sclerosis).   Otherwise unremarkable noncontrast MRI appearance of the brain for age.  IMPRESSION/PLAN: Right hand bradykinesia, rigidity, and hyperreflexia need to evaluate for atypical parkinsonism.  MRI brain was personally viewed with patient and does not show structural pathology to explain her symptoms.  Next step is to check DAT scan.  If this is positive, will start sinemet.  Return to clinic in 2 months  Thank you for allowing me to participate in patient's care.  If I can answer any additional questions, I would be pleased to do so.    Sincerely,    Hairo Garraway K. Posey Pronto, DO

## 2021-03-27 NOTE — Patient Instructions (Signed)
We will order DAT scan and be in touch with you after we get the results.  Return to clinic in 2 months

## 2021-04-13 ENCOUNTER — Telehealth: Payer: Self-pay | Admitting: Neurology

## 2021-04-13 NOTE — Telephone Encounter (Signed)
[facility] waiting on Insurance approval once received will contact [facility] to schedule.

## 2021-04-13 NOTE — Telephone Encounter (Signed)
Pt would like a call to discuss a test that she was supposed to have. She said she has not had it  yet, has not recv a call. She doesn't remember what the test was called. All she knows is its supposed to be at [facility] long

## 2021-04-16 ENCOUNTER — Other Ambulatory Visit (HOSPITAL_COMMUNITY): Payer: Self-pay | Admitting: Neurology

## 2021-04-16 DIAGNOSIS — R251 Tremor, unspecified: Secondary | ICD-10-CM

## 2021-05-05 ENCOUNTER — Encounter (HOSPITAL_COMMUNITY)
Admission: RE | Admit: 2021-05-05 | Discharge: 2021-05-05 | Disposition: A | Discharge status: Home / Self Care | Payer: BC Managed Care – PPO | Source: Ambulatory Visit | Attending: Neurology | Admitting: Neurology

## 2021-05-05 ENCOUNTER — Ambulatory Visit (HOSPITAL_COMMUNITY)
Admission: RE | Admit: 2021-05-05 | Discharge: 2021-05-05 | Disposition: A | Discharge status: Home / Self Care | Payer: BC Managed Care – PPO | Source: Ambulatory Visit | Attending: Neurology | Admitting: Neurology

## 2021-05-05 ENCOUNTER — Other Ambulatory Visit: Payer: Self-pay

## 2021-05-05 DIAGNOSIS — R251 Tremor, unspecified: Secondary | ICD-10-CM | POA: Diagnosis not present

## 2021-05-05 MED ORDER — POTASSIUM IODIDE (ANTIDOTE) 130 MG PO TABS: 130.0000 mg | ORAL_TABLET | Freq: Once | ORAL | Status: DC

## 2021-05-05 MED ORDER — IOFLUPANE I 123 185 MBQ/2.5ML IV SOLN
4.8000 | Freq: Once | INTRAVENOUS | Status: AC | PRN
  Administered 2021-05-05: 4.8 via INTRAVENOUS
  Filled 2021-05-05: qty 5

## 2021-05-05 MED ORDER — POTASSIUM IODIDE (ANTIDOTE) 130 MG PO TABS
ORAL_TABLET | ORAL | Status: AC
  Filled 2021-05-05: qty 1

## 2021-06-01 ENCOUNTER — Other Ambulatory Visit: Payer: Self-pay

## 2021-06-01 ENCOUNTER — Encounter: Payer: Self-pay | Admitting: Neurology

## 2021-06-01 ENCOUNTER — Ambulatory Visit: Payer: BC Managed Care – PPO | Admitting: Neurology

## 2021-06-01 VITALS — BP 161/101 | HR 95 | Ht 67.0 in | Wt 164.0 lb

## 2021-06-01 DIAGNOSIS — G20 Parkinson's disease: Secondary | ICD-10-CM | POA: Diagnosis not present

## 2021-06-01 MED ORDER — CARBIDOPA-LEVODOPA 25-100 MG PO TABS: 1.0000 | ORAL_TABLET | Freq: Three times a day (TID) | ORAL | 3 refills | Status: DC

## 2021-06-01 NOTE — Patient Instructions (Addendum)
Start Carbidopa Levodopa as follows at least 30-min prior to meals:     AM  Afternoon   Evening   Week 1:  1/2 tab  1/2 tab   1/2 tab  Week 2:   1/2 tab  1/2 tab   1 tab  Week 3:  1/2 tab  1 tab   1 tab  Week 4:  1 tab  1 tab   1 tab  *Avoid taking with protein products, such as milk, meat, cheese  *if you develop nausea, take with crackers  Please expect a call from our social worker, [place], to provide you with resources  Return to clinic in 2-3 months

## 2021-06-01 NOTE — Progress Notes (Signed)
Follow-up Visit   Date: 06/01/21   Mary Humphrey MRN: [id number] DOB: 1955/09/16   Interim History: Mary Humphrey is a 65 y.o. right-handed Caucasian female  returning to the clinic for follow-up of right hand difficulty.  The patient was accompanied to the clinic by self.  History of present illness: Starting around 2019, she began noticing that her right hand felt more clumsy and movements were not the same, as compared to the left.  Over the past year, her ability to grip is weaker and she has difficulty writing or painting with her grandchildren.  Fine finger movements, such as applying makeup, is difficult. Small buttons are tricky.  She denies tremor or stiffness. She denies numbness, tingling, or neck pain.  MRI cervical spine from March 2022 shows mild degenerative changes at C4-5 and C5-6 causing mild canal stenosis.  No RLS, constipation, or lack of smell.  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   UPDATE 06/01/2021:  She is here to discuss the results of her DAT scan which showed reduced dopamine uptake.  There has been no significant change in her symptoms. Her right hand continues to be stiff and difficult to perform fine motor tasks, such as writing.  No tremors or falls. She has started yoga for balance.    Medications:  Current Outpatient Medications on File Prior to Visit  Medication Sig Dispense Refill   BREO ELLIPTA 100-25 MCG/INH AEPB INL 1 PUFF PO QD  5   cetirizine (ZYRTEC) 10 MG tablet Take 1 tablet by mouth daily as needed.     desoximetasone (TOPICORT) 0.05 % cream APPLY TOPICALLY TO THE AFFECTED AREA BID PRF ECZEMA FLARE  1   DUPIXENT 300 MG/2ML prefilled syringe      EPINEPHrine 0.3 mg/0.3 mL IJ SOAJ injection Inject into the muscle once.     EPINEPHrine 0.3 mg/0.3 mL IJ SOAJ injection Inject into the muscle.     Fluticasone-Salmeterol (ADVAIR) 100-50 MCG/DOSE AEPB Inhale 1 puff into the lungs every 12 (twelve) hours. 60 each 11   azelastine (OPTIVAR)  0.05 % ophthalmic solution Place 1 drop into both eyes 2 (two) times daily. (Patient not taking: No sig reported) 6 mL 3   cetirizine (ZYRTEC) 10 MG tablet Take 10 mg by mouth daily. (Patient not taking: No sig reported)     No current facility-administered medications on file prior to visit.    Allergies:  Allergies  Allergen Reactions   Doxycycline Other (See Comments)    Gi UPSET-DIARRHEAQ    Vital Signs:  BP (!) 161/101   Pulse 95   Ht 5\' 7"  (1.702 m)   Wt 164 lb (74.4 kg)   SpO2 95%   BMI 25.69 kg/m     Neurological Exam: MENTAL STATUS including orientation to time, place, person, recent and remote memory, attention span and concentration, language, and fund of knowledge is normal.  Speech is not dysarthric.  Blunted affect.   CRANIAL NERVES:  Pupils equal round and reactive to light.  Normal conjugate, extra-ocular eye movements in all directions of gaze.  No ptosis.    MOTOR:  Motor strength is 5/5 in all extremities.  Right hand with moderate rigidity.  Mild right pronator drift.   MSRs:                                           Right  Left brachioradialis 3+  2+  biceps 3+  2+  triceps 2+  2+  patellar 3+  2+  ankle jerk 2+  2+   COORDINATION/GAIT:   Prominent bradykinesia with reduced amplitude and rate of finger tapping. Toe tapping is mildly slowed on the right.. Gait narrow based and stable, absent arm swing on the right.   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   There are a few small scattered T2/FLAIR hyperintense signal changes within the cerebral white matter, the largest within the left subinsular white matter measuring 5 mm. These signal changes are nonspecific, but most often secondary to chronic small vessel ischemia. Potential alternative  considerations include sequela of a prior infectious/inflammatory process, sequela of chronic migraine headaches or sequela of a demyelinating process (such as multiple sclerosis).   Otherwise unremarkable noncontrast MRI appearance of the brain for age.  DAT scan 05/06/2021: Decreased counts in the bilateral posterior striatum and head of the LEFT caudate nucleus is a pattern typical of Parkinsonian syndrome pathology.   Of note, DaTSCAN is not diagnostic of Parkinsonian syndromes, which remains a clinical diagnosis. DaTscan is an adjuvant test to aid I the clinical diagnosis of Parkinsonian syndromes.    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  Results and management options discussed.   - Start sinemet 1 tablet half tablet three times daily then titrate to 1 tablet three times daily (schedule provided)  - Literature provided on Parkinson's disease.  I will also provide her with our movement disorder social worker's contact information.   Return to clinic in 2 months  Thank you for allowing me to participate in patient's care.  If I can answer any additional questions, I would be pleased to do so.    Sincerely,    Riyan Gavina K. Posey Pronto, DO

## 2021-06-04 ENCOUNTER — Telehealth: Payer: Self-pay | Admitting: Licensed Clinical Social Worker

## 2021-06-04 NOTE — Telephone Encounter (Signed)
LCSW made telephone contact with Pt.  Pt was recently received dx of Parkinson's and did have some concerns, thoughts, and questions.  LCSW provided support, resources, and guidance to her questions and concerns. Pt is looking at possible doing cycling and does have support system at home with husband.  LCSW provided contact information for follow up as needed.

## 2021-09-03 NOTE — Progress Notes (Signed)
Follow-up Visit   Date: 09/03/21   Mary Humphrey MRN: [id number] DOB: 10/29/1955   Interim History: Mary Humphrey is a 66 y.o. right-handed Caucasian female  returning to the clinic for follow-up of parkinson's disease.  The patient was accompanied to the clinic by self.  History of present illness: Starting around 2019, she began noticing that her right hand felt more clumsy and movements were not the same, as compared to the left.  Over the past year, her ability to grip is weaker and she has difficulty writing or painting with her grandchildren.  Fine finger movements, such as applying makeup, is difficult. Small buttons are tricky.  She denies tremor or stiffness. She denies numbness, tingling, or neck pain.  MRI cervical spine from March 2022 shows mild degenerative changes at C4-5 and C5-6 causing mild canal stenosis.  No RLS, constipation, or lack of smell.  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   UPDATE 06/01/2021:  She is here to discuss the results of her DAT scan which showed reduced dopamine uptake.  There has been no significant change in her symptoms. Her right hand continues to be stiff and difficult to perform fine motor tasks, such as writing.  No tremors or falls. She has started yoga for balance.   UPDATE 09/03/2021:  She is here for follow-up visit.  She started sinemet 1 tablet TID and has been tolerating it well. It has helped her fine motor tasks and she is able to write better.  Movements are not longer clumsy. No tremor, weakness, or gait difficulty. She stays active and going to the gym regularly.    Medications:  Current Outpatient Medications on File Prior to Visit  Medication Sig Dispense Refill   azelastine (OPTIVAR) 0.05 % ophthalmic solution Place 1 drop into both eyes 2 (two) times daily. (Patient not taking: No sig reported) 6 mL 3   BREO ELLIPTA 100-25 MCG/INH AEPB INL 1 PUFF PO QD  5   carbidopa-levodopa (SINEMET IR) 25-100 MG tablet Take 1 tablet  by mouth 3 (three) times daily. 90 tablet 3   cetirizine (ZYRTEC) 10 MG tablet Take 10 mg by mouth daily. (Patient not taking: No sig reported)     cetirizine (ZYRTEC) 10 MG tablet Take 1 tablet by mouth daily as needed.     desoximetasone (TOPICORT) 0.05 % cream APPLY TOPICALLY TO THE AFFECTED AREA BID PRF ECZEMA FLARE  1   DUPIXENT 300 MG/2ML prefilled syringe      EPINEPHrine 0.3 mg/0.3 mL IJ SOAJ injection Inject into the muscle once.     EPINEPHrine 0.3 mg/0.3 mL IJ SOAJ injection Inject into the muscle.     Fluticasone-Salmeterol (ADVAIR) 100-50 MCG/DOSE AEPB Inhale 1 puff into the lungs every 12 (twelve) hours. 60 each 11   No current facility-administered medications on file prior to visit.    Allergies:  Allergies  Allergen Reactions   Doxycycline Other (See Comments)    Gi UPSET-DIARRHEAQ    Vital Signs:  There were no vitals taken for this visit.    Neurological Exam: MENTAL STATUS including orientation to time, place, person, recent and remote memory, attention span and concentration, language, and fund of knowledge is normal.  Speech is not dysarthric.  Blunted affect.   CRANIAL NERVES:  Pupils equal round and reactive to light.  Normal conjugate, extra-ocular eye movements in all directions of gaze.  No ptosis.    MOTOR:  Motor strength is 5/5 in all extremities.  Right hand  with trace rigidity (improved).  Mild right pronator drift.   MSRs:                                           Right        Left brachioradialis 3+  2+  biceps 3+  2+  triceps 2+  2+  patellar 3+  2+  ankle jerk 2+  2+   COORDINATION/GAIT:   Mild bradykinesia with reduced amplitude and rate of finger tapping (improved). Toe tapping is mildly slowed on the right. Gait narrow based and stable, absent arm swing on the right.   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   There are a few small scattered T2/FLAIR hyperintense signal changes within the cerebral white matter, the largest within the left subinsular white matter measuring 5 mm. These signal changes are nonspecific, but most often secondary to chronic small vessel ischemia. Potential alternative considerations include sequela of a prior infectious/inflammatory process, sequela of chronic migraine headaches or sequela of a demyelinating process (such as multiple sclerosis).   Otherwise unremarkable noncontrast MRI appearance of the brain for age.  DAT scan 05/06/2021: Decreased counts in the bilateral posterior striatum and head of the LEFT caudate nucleus is a pattern typical of Parkinsonian syndrome pathology.   Of note, DaTSCAN is not diagnostic of Parkinsonian syndromes, which remains a clinical diagnosis. DaTscan is an adjuvant test to aid I the clinical diagnosis of Parkinsonian syndromes.    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  She was started on sinemet in October 2022 and noticed marked improvement in fine motor tasks.   - Continue sinemet 25/100 1 tablet at 8a, 1p, 6p    - Encouraged to start home exercises for fine motor movements  Return to clinic in 9 months  Thank you for allowing me to participate in patient's care.  If I can answer any additional questions, I would be pleased to do so.    Sincerely,    Elyon Zoll K. Posey Pronto, DO

## 2021-09-04 ENCOUNTER — Encounter: Payer: Self-pay | Admitting: Neurology

## 2021-09-04 ENCOUNTER — Other Ambulatory Visit: Payer: Self-pay

## 2021-09-04 ENCOUNTER — Ambulatory Visit: Payer: Medicare PPO | Admitting: Neurology

## 2021-09-04 VITALS — BP 126/83 | HR 78 | Ht 67.0 in | Wt 164.0 lb

## 2021-09-04 DIAGNOSIS — G20 Parkinson's disease: Secondary | ICD-10-CM

## 2021-09-04 MED ORDER — CARBIDOPA-LEVODOPA 25-100 MG PO TABS: 1.0000 | ORAL_TABLET | Freq: Three times a day (TID) | ORAL | 3 refills | Status: DC

## 2021-09-04 NOTE — Patient Instructions (Signed)
Continue sinemet 25/100 1 tablet at 8a, 1p, 6p   Continue exercises  Return to clinic in 9 months

## 2022-03-25 ENCOUNTER — Other Ambulatory Visit: Payer: Self-pay | Admitting: Family Medicine

## 2022-03-25 DIAGNOSIS — Z1231 Encounter for screening mammogram for malignant neoplasm of breast: Secondary | ICD-10-CM

## 2022-04-13 ENCOUNTER — Ambulatory Visit
Admission: RE | Admit: 2022-04-13 | Discharge: 2022-04-13 | Disposition: A | Discharge status: Home / Self Care | Payer: Medicare PPO | Source: Ambulatory Visit | Attending: Family Medicine | Admitting: Family Medicine

## 2022-04-13 DIAGNOSIS — Z1231 Encounter for screening mammogram for malignant neoplasm of breast: Secondary | ICD-10-CM

## 2022-05-11 ENCOUNTER — Encounter: Payer: Self-pay | Admitting: Neurology

## 2022-06-04 ENCOUNTER — Ambulatory Visit: Payer: Medicare PPO | Admitting: Neurology

## 2022-06-11 ENCOUNTER — Ambulatory Visit: Payer: Medicare PPO | Admitting: Neurology

## 2022-06-11 ENCOUNTER — Encounter: Payer: Self-pay | Admitting: Neurology

## 2022-06-11 VITALS — BP 151/90 | HR 57 | Ht 67.0 in | Wt 168.0 lb

## 2022-06-11 DIAGNOSIS — G20A1 Parkinson's disease without dyskinesia, without mention of fluctuations: Secondary | ICD-10-CM | POA: Diagnosis not present

## 2022-06-11 MED ORDER — CARBIDOPA-LEVODOPA 25-100 MG PO TABS: 1.0000 | ORAL_TABLET | Freq: Three times a day (TID) | ORAL | 3 refills | Status: DC

## 2022-06-11 NOTE — Progress Notes (Signed)
Follow-up Visit   Date: 06/11/22   Mary Humphrey MRN: [id number] DOB: 1956/07/07   Interim History: Mary Humphrey is a 66 y.o. right-handed Caucasian female  returning to the clinic for follow-up of parkinson's disease.  The patient was accompanied to the clinic by self.  History of present illness: Starting around 2019, she began noticing that her right hand felt more clumsy and movements were not the same, as compared to the left.  Over the past year, her ability to grip is weaker and she has difficulty writing or painting with her grandchildren.  Fine finger movements, such as applying makeup, is difficult. Small buttons are tricky.  She denies tremor or stiffness. She denies numbness, tingling, or neck pain.  MRI cervical spine from March 2022 shows mild degenerative changes at C4-5 and C5-6 causing mild canal stenosis.  No RLS, constipation, or lack of smell.  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  DAT scan showed reduced dopamine uptake.    UPDATE 09/03/2021:  She is here for follow-up visit.  She started sinemet 1 tablet TID and has been tolerating it well. It has helped her fine motor tasks and she is able to write better.  Movements are not longer clumsy. No tremor, weakness, or gait difficulty. She stays active and going to the gym regularly.   UPDATE 06/11/2022:  She is here for follow-up visit.  She has been doing well and denies any new complaints.  She is tolerating sinemet 1 tablet TID and feels this continues to allow her to use her hand well, including hand writing.  No interval falls, illnesses, or new medications.  No tremor, stiffness, or slowed movements.  Balance is good. No problems with constipation and since starting Dupixent for allergies, scent of smell has improved.    Medications:  Current Outpatient Medications on File Prior to Visit  Medication Sig Dispense Refill   BREO ELLIPTA 100-25 MCG/INH AEPB INL 1 PUFF PO QD  5   cetirizine (ZYRTEC) 10 MG tablet  Take 1 tablet by mouth daily as needed.     desoximetasone (TOPICORT) 0.05 % cream APPLY TOPICALLY TO THE AFFECTED AREA BID PRF ECZEMA FLARE  1   DUPIXENT 300 MG/2ML prefilled syringe      EPINEPHrine 0.3 mg/0.3 mL IJ SOAJ injection Inject into the muscle once.     EPINEPHrine 0.3 mg/0.3 mL IJ SOAJ injection Inject into the muscle.     fluticasone furoate-vilanterol (BREO ELLIPTA) 100-25 MCG/ACT AEPB INHALE 1 PUFF BY MOUTH EVERY DAY Inhalation Once a day for 30 days     No current facility-administered medications on file prior to visit.    Allergies:  Allergies  Allergen Reactions   Doxycycline Other (See Comments)    Gi UPSET-DIARRHEAQ    Vital Signs:  BP (!) 151/90   Pulse (!) 57   Ht '5\' 7"'$  (1.702 m)   Wt 168 lb (76.2 kg)   SpO2 100%   BMI 26.31 kg/m    Neurological Exam: MENTAL STATUS including orientation to time, place, person, recent and remote memory, attention span and concentration, language, and fund of knowledge is normal.  Speech is not dysarthric.  Blunted affect.   CRANIAL NERVES:  Pupils equal round and reactive to light.  Normal conjugate, extra-ocular eye movements in all directions of gaze.  No ptosis.    MOTOR:  Motor strength is 5/5 in all extremities.  Right hand with trace rigidity (stable).  Mild right pronator drift.   MSRs:  Right        Left brachioradialis 3+  2+  biceps 3+  2+  triceps 2+  2+  patellar 3+  2+  ankle jerk 2+  2+   COORDINATION/GAIT:   Mild bradykinesia with reduced amplitude and rate of finger tapping (stable). Gait narrow based and stable, reduced arm swing on the right.   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   There are a few small scattered T2/FLAIR hyperintense signal  changes within the cerebral white matter, the largest within the left subinsular white matter measuring 5 mm. These signal changes are nonspecific, but most often secondary to chronic small vessel ischemia. Potential alternative considerations include sequela of a prior infectious/inflammatory process, sequela of chronic migraine headaches or sequela of a demyelinating process (such as multiple sclerosis).   Otherwise unremarkable noncontrast MRI appearance of the brain for age.  DAT scan 05/06/2021: Decreased counts in the bilateral posterior striatum and head of the LEFT caudate nucleus is a pattern typical of Parkinsonian syndrome pathology.   Of note, DaTSCAN is not diagnostic of Parkinsonian syndromes, which remains a clinical diagnosis. DaTscan is an adjuvant test to aid I the clinical diagnosis of Parkinsonian syndromes.    IMPRESSION/PLAN: Parkinson's disease manifesting with akinetic-rigid syndrome involving the right hand and confirmed by positive DAT scan.  She was started on sinemet in October 2022 and noticed marked improvement in fine motor tasks.   - Continue sinemet 25/'100mg'$  1 tablet at 8a, 1p, 6p    - Continue home exercises   Return to clinic in 1 year  Thank you for allowing me to participate in patient's care.  If I can answer any additional questions, I would be pleased to do so.    Sincerely,    Daissy Yerian K. Posey Pronto, DO

## 2022-06-11 NOTE — Patient Instructions (Signed)
Great to see you today!  Return to clinic in 1 year

## 2022-12-07 ENCOUNTER — Encounter: Payer: Self-pay | Admitting: Internal Medicine

## 2022-12-07 ENCOUNTER — Ambulatory Visit: Payer: Medicare PPO | Attending: Internal Medicine | Admitting: Internal Medicine

## 2022-12-07 ENCOUNTER — Ambulatory Visit (INDEPENDENT_AMBULATORY_CARE_PROVIDER_SITE_OTHER): Payer: Medicare PPO

## 2022-12-07 VITALS — BP 136/86 | HR 62 | Ht 67.0 in | Wt 162.0 lb

## 2022-12-07 DIAGNOSIS — R002 Palpitations: Secondary | ICD-10-CM

## 2022-12-07 NOTE — Patient Instructions (Signed)
Medication Instructions:  No  changes     Lab Work: Not needed   Testing/Procedures: Your physician has recommended that you wear a holter monitor 14 day Zio. Holter monitors are medical devices that record the heart's electrical activity. Doctors most often use these monitors to diagnose arrhythmias. Arrhythmias are problems with the speed or rhythm of the heartbeat. The monitor is a small, portable device. You can wear one while you do your normal daily activities. This is usually used to diagnose what is causing palpitations/syncope (passing out).    Follow-Up: At [facility], you and your health needs are our priority.  As part of our continuing mission to provide you with exceptional heart care, we have created designated Provider Care Teams.  These Care Teams include your primary Cardiologist (physician) and Advanced Practice Providers (APPs -  Physician Assistants and Nurse Practitioners) who all work together to provide you with the care you need, when you need it.     Your next appointment:   As needed  The format for your next appointment:   In Person  Provider:   Maisie Fus, MD    Other Instructions   Mary Humphrey- Long Term Monitor Instructions  Your physician has requested you wear a ZIO patch monitor for 14 days.  This is a single patch monitor. [employer] supplies one patch monitor per enrollment. Additional stickers are not available. Please do not apply patch if you will be having a Nuclear Stress Test,  Echocardiogram, Cardiac CT, MRI, or Chest Xray during the period you would be wearing the  monitor. The patch cannot be worn during these tests. You cannot remove and re-apply the  ZIO XT patch monitor.  Your ZIO patch monitor will be mailed 3 day USPS to your address on file. It may take 3-5 days  to receive your monitor after you have been enrolled.  Once you have received your monitor, please review the enclosed instructions. Your monitor  has already been  registered assigning a specific monitor serial # to you.  Billing and Patient Assistance Program Information  We have supplied [employer] with any of your insurance information on file for billing purposes. [employer] offers a sliding scale Patient Assistance Program for patients that do not have  insurance, or whose insurance does not completely cover the cost of the ZIO monitor.  You must apply for the Patient Assistance Program to qualify for this discounted rate.  To apply, please call [employer] at [phone], select option 4, select option 2, ask to apply for  Patient Assistance Program. Mary Humphrey will ask your household income, and how many people  are in your household. They will quote your out-of-pocket cost based on that information.  [employer] will also be able to set up a 68-month, interest-free payment plan if needed.  Applying the monitor   Shave hair from upper left chest.  Hold abrader disc by orange tab. Rub abrader in 40 strokes over the upper left chest as  indicated in your monitor instructions.  Clean area with 4 enclosed alcohol pads. Let dry.  Apply patch as indicated in monitor instructions. Patch will be placed under collarbone on left  side of chest with arrow pointing upward.  Rub patch adhesive wings for 2 minutes. Remove white label marked "1". Remove the white  label marked "2". Rub patch adhesive wings for 2 additional minutes.  While looking in a mirror, press and release button in center of patch. A small green light will  flash 3-4  times. This will be your only indicator that the monitor has been turned on.  Do not shower for the first 24 hours. You may shower after the first 24 hours.  Press the button if you feel a symptom. You will hear a small click. Record Date, Time and  Symptom in the Patient Logbook.  When you are ready to remove the patch, follow instructions on the last 2 pages of Patient  Logbook. Stick patch monitor onto the last page of Patient  Logbook.  Place Patient Logbook in the blue and white box. Use locking tab on box and tape box closed  securely. The blue and white box has prepaid postage on it. Please place it in the mailbox as  soon as possible. Your physician should have your test results approximately 7 days after the  monitor has been mailed back to [facility].  Call [facility] Customer Care at [phone] if you have questions regarding  your ZIO XT patch monitor. Call them immediately if you see an orange light blinking on your  monitor.  If your monitor falls off in less than 4 days, contact our Monitor department at [phone].  If your monitor becomes loose or falls off after 4 days call [employer] at [phone] for  suggestions on securing your monitor

## 2022-12-07 NOTE — Progress Notes (Signed)
Cardiology Office Note:    Date:  12/07/2022   ID:  Mary Humphrey, DOB 1955/12/29, MRN [id number]  PCP:  Mary Davenport, MD   [facility] Providers Cardiologist:  None     Referring MD: Mary Davenport, MD   No chief complaint on file. ?MAT  History of Present Illness:    Mary Humphrey is a 67 y.o. female with a hx of asthma, Parkinson's disease,  referral for MAT?Marland Kitchen She was seen by her PCP Dr. Hal Humphrey. Mary Humphrey notes she felt dizzy and LH. She also noted palpitations. She does Pilates and was limited. She asked for an evaluation. EKGs in the office showed NSR and LAD. Showed was placed on a cardiac  monitor in her PCP's office, which showed runs of PACs. This was last Thursday. She drank water.  It took a few days until it felt better. The LH is resolved. No syncope.No CP or concerning SOB or LE edema. No caffeine. She has a few glasses of wine a week.  No prior cardiac w/u. Mother had an "irregular heart beat".    Past Medical History:  Diagnosis Date   Allergy    environmental and seasonal   Asthma    Eczema 2019   Environmental and seasonal allergies    Sinusitis     Past Surgical History:  Procedure Laterality Date   COLONOSCOPY  06/24/2014   SSP and TA JP   KNEE CARTILAGE SURGERY     Meniscus   SINUS ENDO W/FUSION Bilateral 09/09/2014   Procedure: BILATERAL ENDOSCOPIC TOTAL ETHMOIDECTOMY/BILATERAL ENDOSCOPIC MAXILLARY ANTROSTOMY/BILATERAL ENDOSCOPIC FRONTAL RECESS EXPLORATION/BILATERAL ENDOSCOPIC SPHENOIDECTOMY WITH FUSION NAVIGATION;  Surgeon: Darletta Moll, MD;  Location: [facility];  Service: ENT;  Laterality: Bilateral;    Current Medications: Current Outpatient Medications on File Prior to Visit  Medication Sig Dispense Refill   BREO ELLIPTA 100-25 MCG/INH AEPB INL 1 PUFF PO QD  5   carbidopa-levodopa (SINEMET IR) 25-100 MG tablet Take 1 tablet by mouth 3 (three) times daily. 270 tablet 3   cetirizine (ZYRTEC) 10 MG tablet Take 1 tablet  by mouth daily as needed.     DUPIXENT 300 MG/2ML prefilled syringe      EPINEPHrine 0.3 mg/0.3 mL IJ SOAJ injection Inject into the muscle.     No current facility-administered medications on file prior to visit.    Allergies:   Doxycycline   Social History   Socioeconomic History   Marital status: Married    Spouse name: Not on file   Number of children: 2   Years of education: Not on file   Highest education level: Not on file  Occupational History   Occupation: Administration    Employer: [employer]  Tobacco Use   Smoking status: Never   Smokeless tobacco: Never  Vaping Use   Vaping Use: Never used  Substance and Sexual Activity   Alcohol use: Yes    Alcohol/week: 0.0 standard drinks of alcohol    Comment: 2-3 drinks-socially   Drug use: No   Sexual activity: Yes    Birth control/protection: Post-menopausal  Other Topics Concern   Not on file  Social History Narrative   Marital status:  Married x 33 years   Children: two children; no grandchildren   Lives: with husband   Employment:  Chiropodist at [employer]  Tobacco: none   Alcohol:  Socially three times weekly   Exercise: none      Right Handed    Lives in  a two story home   Social Determinants of Corporate investment banker Strain: Not on file  Food Insecurity: Not on file  Transportation Needs: Not on file  Physical Activity: Not on file  Stress: Not on file  Social Connections: Not on file     Family History: The patient's family history includes Asthma in her sister; Heart disease in her father. There is no history of Colon cancer, Rectal cancer, Stomach cancer, Colon polyps, or Esophageal cancer.  ROS:   Please see the history of present illness.     All other systems reviewed and are negative.  EKGs/Labs/Other Studies Reviewed:    The following studies were reviewed today:   EKG:  EKG is  ordered today.  The ekg ordered today demonstrates   12/07/2022- NSR, LAD  Recent Labs: No  results found for requested labs within last 365 days.   Recent Lipid Panel    Component Value Date/Time   CHOL 189 01/19/2017 0957   TRIG 111 01/19/2017 0957   HDL 52 01/19/2017 0957   CHOLHDL 3.6 01/19/2017 0957   CHOLHDL 3.1 04/17/2014 1203   VLDL 13 04/17/2014 1203   LDLCALC 115 (H) 01/19/2017 0957     Risk Assessment/Calculations:         Physical Exam:    VS:   Vitals:   12/07/22 0826  BP: 136/86  Pulse: 62  SpO2: 96%     Wt Readings from Last 3 Encounters:  12/07/22 162 lb (73.5 kg)  06/11/22 168 lb (76.2 kg)  09/04/21 164 lb (74.4 kg)     GEN:  Well nourished, well developed in no acute distress HEENT: Normal NECK: No JVD CARDIAC: RRR, no murmurs, rubs, gallops RESPIRATORY:  Clear to auscultation without rales, wheezing or rhonchi  ABDOMEN:  non-tender, non-distended MUSCULOSKELETAL:  No edema; No deformity  SKIN: Warm and dry NEUROLOGIC:  Alert and oriented x 3 PSYCHIATRIC:  Normal affect   ASSESSMENT:    Palpitations: The monitor showed a run of PACs. This is benign. We discussed this can be seen in the setting of dehydration, use of caffeine etc. The management is the underlying cause. Will be comprehensive and get a cardiac monitor to assess for any concerning arrhythmia.  PLAN:    In order of problems listed above:  14 day ziopatch Follow up pending results      Medication Adjustments/Labs and Tests Ordered: Current medicines are reviewed at length with the patient today.  Concerns regarding medicines are outlined above.  Orders Placed This Encounter  Procedures   EKG 12-Lead   No orders of the defined types were placed in this encounter.   There are no Patient Instructions on file for this visit.   Signed, Maisie Fus, MD  12/07/2022 8:44 AM     [facility]

## 2022-12-07 NOTE — Progress Notes (Unsigned)
Enrolled for [employer] to mail a ZIO XT long term holter monitor to the patients address on file.  

## 2022-12-10 DIAGNOSIS — R002 Palpitations: Secondary | ICD-10-CM | POA: Diagnosis not present

## 2022-12-30 ENCOUNTER — Encounter: Payer: Self-pay | Admitting: Internal Medicine

## 2023-01-27 DIAGNOSIS — H1045 Other chronic allergic conjunctivitis: Secondary | ICD-10-CM | POA: Insufficient documentation

## 2023-01-27 DIAGNOSIS — J4540 Moderate persistent asthma, uncomplicated: Secondary | ICD-10-CM | POA: Insufficient documentation

## 2023-01-27 DIAGNOSIS — J3081 Allergic rhinitis due to animal (cat) (dog) hair and dander: Secondary | ICD-10-CM | POA: Insufficient documentation

## 2023-01-27 DIAGNOSIS — J330 Polyp of nasal cavity: Secondary | ICD-10-CM | POA: Insufficient documentation

## 2023-01-27 DIAGNOSIS — J301 Allergic rhinitis due to pollen: Secondary | ICD-10-CM | POA: Insufficient documentation

## 2023-01-27 DIAGNOSIS — J324 Chronic pansinusitis: Secondary | ICD-10-CM | POA: Insufficient documentation

## 2023-01-31 ENCOUNTER — Ambulatory Visit (AMBULATORY_SURGERY_CENTER): Payer: Medicare PPO

## 2023-01-31 VITALS — Ht 66.0 in | Wt 160.0 lb

## 2023-01-31 DIAGNOSIS — Z86010 Personal history of colonic polyps: Secondary | ICD-10-CM

## 2023-01-31 MED ORDER — NA SULFATE-K SULFATE-MG SULF 17.5-3.13-1.6 GM/177ML PO SOLN: 1.0000 | Freq: Once | ORAL | 0 refills | Status: AC

## 2023-01-31 NOTE — Progress Notes (Signed)
Pre visit completed via phone call; Patient verified name, DOB, and address;  No egg or soy allergy known to patient;  No issues known to pt with past sedation with any surgeries or procedures; Patient denies ever being told they had issues or difficulty with intubation;  No FH of Malignant Hyperthermia; Pt is not on diet pills; Pt is not on home 02;  Pt is not on blood thinners;  Pt denies issues with constipation  No A fib or A flutter; Have any cardiac testing pending--NO Pt instructed to use [email or web address] or GoodRx for a price reduction on prep;   Insurance verified during PV appt=[employer]  Patient's chart reviewed by Cathlyn Parsons CNRA prior to previsit and patient appropriate for the [facility].  Previsit completed and red dot placed by patient's name on their procedure day (on provider's schedule).    Instructions sent to patient's MyChart per her request; GoodRx info in RX;

## 2023-02-02 ENCOUNTER — Encounter: Payer: Self-pay | Admitting: Internal Medicine

## 2023-02-28 ENCOUNTER — Encounter: Payer: Medicare PPO | Admitting: Internal Medicine

## 2023-04-04 ENCOUNTER — Encounter: Payer: Medicare PPO | Admitting: Internal Medicine

## 2023-05-25 ENCOUNTER — Other Ambulatory Visit: Payer: Self-pay | Admitting: Family Medicine

## 2023-05-25 DIAGNOSIS — Z1231 Encounter for screening mammogram for malignant neoplasm of breast: Secondary | ICD-10-CM

## 2023-05-30 ENCOUNTER — Ambulatory Visit (AMBULATORY_SURGERY_CENTER): Payer: Medicare PPO

## 2023-05-30 VITALS — Ht 67.0 in | Wt 165.0 lb

## 2023-05-30 DIAGNOSIS — Z860100 Personal history of colon polyps, unspecified: Secondary | ICD-10-CM

## 2023-05-30 MED ORDER — PEG 3350-KCL-NA BICARB-NACL 420 G PO SOLR: 4000.0000 mL | Freq: Once | ORAL | 0 refills | Status: AC

## 2023-05-30 NOTE — Progress Notes (Signed)

## 2023-06-08 ENCOUNTER — Encounter: Payer: Self-pay | Admitting: Internal Medicine

## 2023-06-13 ENCOUNTER — Encounter: Payer: Self-pay | Admitting: Neurology

## 2023-06-13 ENCOUNTER — Ambulatory Visit: Payer: Medicare PPO | Admitting: Neurology

## 2023-06-13 VITALS — BP 117/77 | HR 91 | Ht 67.0 in | Wt 161.0 lb

## 2023-06-13 DIAGNOSIS — G20A1 Parkinson's disease without dyskinesia, without mention of fluctuations: Secondary | ICD-10-CM

## 2023-06-13 MED ORDER — CARBIDOPA-LEVODOPA 25-100 MG PO TABS: 1.0000 | ORAL_TABLET | Freq: Two times a day (BID) | ORAL | 3 refills | Status: AC

## 2023-06-13 NOTE — Progress Notes (Signed)
Follow-up Visit   Date: 06/13/23   [name] MRN: [id number] DOB: Jan 06, 1956   Interim History: Mary Humphrey is a 67 y.o. right-handed Caucasian female  returning to the clinic for follow-up of parkinson's disease.  The patient was accompanied to the clinic by self.  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  Stable.  Exam with trace right arm rigidity and mild bradykinesia with finger tapping.   - Continue sinemet 25/100 1 tablet twice daily. OK to take an extra tablet as needed  Return to clinic in 1 year  -------------------------------------------------- History of present illness: Starting around 2019, she began noticing that her right hand felt more clumsy and movements were not the same, as compared to the left.  Over the past year, her ability to grip is weaker and she has difficulty writing or painting with her grandchildren.  Fine finger movements, such as applying makeup, is difficult. Small buttons are tricky.  She denies tremor or stiffness. She denies numbness, tingling, or neck pain.  MRI cervical spine from March 2022 shows mild degenerative changes at C4-5 and C5-6 causing mild canal stenosis.  No RLS, constipation, or lack of smell.  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  DAT scan showed reduced dopamine uptake.    UPDATE 09/03/2021:  She is here for follow-up visit.  She started sinemet 1 tablet TID and has been tolerating it well. It has helped her fine motor tasks and she is able to write better.  Movements are not longer clumsy. No tremor, weakness, or gait difficulty. She stays active and going to the gym regularly.   UPDATE 06/11/2022:  She is here for follow-up visit.  She has been doing well and denies any new complaints.  She is tolerating sinemet 1 tablet TID and feels this continues to allow her to use her hand well, including hand writing.  No interval falls, illnesses, or new  medications.  No tremor, stiffness, or slowed movements.  Balance is good. No problems with constipation and since starting Dupixent for allergies, scent of smell has improved.   UPDATE 06/13/2023:  She is here for follow-up visit.  She has been doing well for the past year and takes sinemet 25/100 twice daily at 7:30a and 11:30a which controls symptoms.  She rarely takes an extra dose.  She denies any off symptoms.  Balance is good, no falls.  She has notice trace right hand tremor.     Medications:  Current Outpatient Medications on File Prior to Visit  Medication Sig Dispense Refill   BREO ELLIPTA 100-25 MCG/INH AEPB INL 1 PUFF PO QD  5   CALCIUM PO Take 1 tablet by mouth daily.     carbidopa-levodopa (SINEMET IR) 25-100 MG tablet Take 1 tablet by mouth 3 (three) times daily. (Patient taking differently: Take 1 tablet by mouth 2 (two) times daily at 10 AM and 5 PM.) 270 tablet 3   cetirizine (ZYRTEC) 10 MG tablet Take 1 tablet by mouth daily as needed.     Cholecalciferol (VITAMIN D-3 PO) Take 1 tablet by mouth daily.     EPINEPHrine 0.3 mg/0.3 mL IJ SOAJ injection Inject into the muscle.     Multiple Vitamins-Minerals (WOMENS MULTIVITAMIN PO) Take 1 tablet by mouth daily.     DUPIXENT 300 MG/2ML prefilled syringe Inject 300 mg into the skin every 14 (fourteen) days. (Patient not taking: Reported on 06/13/2023)     No current facility-administered medications on  file prior to visit.    Allergies:  Allergies  Allergen Reactions   Doxycycline Diarrhea and Nausea And Vomiting    Vital Signs:  BP 117/77   Pulse 91   Ht 5\' 7"  (1.702 m)   Wt 161 lb (73 kg)   SpO2 96%   BMI 25.22 kg/m    Neurological Exam: MENTAL STATUS including orientation to time, place, person, recent and remote memory, attention span and concentration, language, and fund of knowledge is normal.  Speech is not dysarthric.  Blunted affect.   CRANIAL NERVES:  Pupils equal round and reactive to light.  Normal  conjugate, extra-ocular eye movements in all directions of gaze.  No ptosis.    MOTOR:  Motor strength is 5/5 in all extremities.  Very fine tremor with hands out stretched, not present at rest.  Right hand with trace rigidity (stable).  Mild right pronator drift.   MSRs:                                           Right        Left brachioradialis 3+  2+  biceps 3+  2+  triceps 2+  2+  patellar 3+  2+  ankle jerk 2+  2+   COORDINATION/GAIT:   Mild bradykinesia with reduced amplitude and rate of finger tapping (stable). Gait narrow based and stable, reduced arm swing on the right.   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   There are a few small scattered T2/FLAIR hyperintense signal changes within the cerebral white matter, the largest within the left subinsular white matter measuring 5 mm. These signal changes are nonspecific, but most often secondary to chronic small vessel ischemia. Potential alternative considerations include sequela of a prior infectious/inflammatory process, sequela of chronic migraine headaches or sequela of a demyelinating process (such as multiple sclerosis).   Otherwise unremarkable noncontrast MRI appearance of the brain for age.  DAT scan 05/06/2021: Decreased counts in the bilateral posterior striatum and head of the LEFT caudate nucleus is a pattern typical of Parkinsonian syndrome pathology.   Of note, DaTSCAN is not diagnostic of Parkinsonian syndromes, which remains a clinical diagnosis. DaTscan is an adjuvant test to aid I the clinical diagnosis of Parkinsonian syndromes.     Thank you for allowing me to participate in patient's care.  If I can answer any additional questions, I would be pleased to do so.    Sincerely,    Talya Quain K. Allena Katz,  DO

## 2023-06-13 NOTE — Patient Instructions (Signed)
It was great to see you today!  Continue taking your medications as you are taking  I will see you back in 1 year

## 2023-06-20 ENCOUNTER — Encounter: Payer: Self-pay | Admitting: Internal Medicine

## 2023-06-20 ENCOUNTER — Ambulatory Visit: Payer: Medicare PPO | Admitting: Internal Medicine

## 2023-06-20 VITALS — BP 141/77 | HR 50 | Temp 98.6°F | Resp 21 | Ht 67.0 in | Wt 165.0 lb

## 2023-06-20 DIAGNOSIS — D122 Benign neoplasm of ascending colon: Secondary | ICD-10-CM | POA: Diagnosis not present

## 2023-06-20 DIAGNOSIS — Z860100 Personal history of colon polyps, unspecified: Secondary | ICD-10-CM | POA: Diagnosis not present

## 2023-06-20 DIAGNOSIS — Z09 Encounter for follow-up examination after completed treatment for conditions other than malignant neoplasm: Secondary | ICD-10-CM

## 2023-06-20 MED ORDER — SODIUM CHLORIDE 0.9 % IV SOLN: 500.0000 mL | Freq: Once | INTRAVENOUS | Status: DC

## 2023-06-20 NOTE — Progress Notes (Signed)
HISTORY OF PRESENT ILLNESS:  Mary Humphrey is a 67 y.o. female with a history of adenomatous and sessile serrated colon polyps on colonoscopy November 2015.  Now for surveillance  REVIEW OF SYSTEMS:  All non-GI ROS negative except for  Past Medical History:  Diagnosis Date   Arthritis    generalized   Asthma    uses inhaler daily-seasonal   Eczema 2019   Environmental and seasonal allergies    Seasonal allergies    Sinusitis     Past Surgical History:  Procedure Laterality Date   COLONOSCOPY  06/24/2014   SSP and TA JP   KNEE CARTILAGE SURGERY     Meniscus   SINUS ENDO W/FUSION Bilateral 09/09/2014   Procedure: BILATERAL ENDOSCOPIC TOTAL ETHMOIDECTOMY/BILATERAL ENDOSCOPIC MAXILLARY ANTROSTOMY/BILATERAL ENDOSCOPIC FRONTAL RECESS EXPLORATION/BILATERAL ENDOSCOPIC SPHENOIDECTOMY WITH FUSION NAVIGATION;  Surgeon: Darletta Moll, MD;  Location: [facility];  Service: ENT;  Laterality: Bilateral;    Social History Gypsy Lore  reports that she has never smoked. She has never used smokeless tobacco. She reports current alcohol use of about 3.0 standard drinks of alcohol per week. She reports that she does not use drugs.  family history includes Asthma in her sister; Heart disease in her father.  Allergies  Allergen Reactions   Doxycycline Diarrhea and Nausea And Vomiting       PHYSICAL EXAMINATION: Vital signs: BP (!) 143/92   Pulse 70   Temp 98.6 F (37 C)   Ht 5\' 7"  (1.702 m)   Wt 165 lb (74.8 kg)   SpO2 98%   BMI 25.84 kg/m  General: Well-developed, well-nourished, no acute distress HEENT: Sclerae are anicteric, conjunctiva pink. Oral mucosa intact Lungs: Clear Heart: Regular Abdomen: soft, nontender, nondistended, no obvious ascites, no peritoneal signs, normal bowel sounds. No organomegaly. Extremities: No edema Psychiatric: alert and oriented x3. Cooperative     ASSESSMENT:  Personal history adenomatous and sessile serrated  polyps   PLAN:  Surveillance colonoscopy

## 2023-06-20 NOTE — Patient Instructions (Addendum)
- Repeat colonoscopy in 7-10 years for surveillance.                           - Patient has a contact number available for                            emergencies. The signs and symptoms of potential                            delayed complications were discussed with the                            patient. Return to normal activities tomorrow.                            Written discharge instructions were provided to the                            patient.                           - Resume previous diet.                           - Continue present medications.                           - Await pathology results.                            -Handouts given to patient.  (1 polyp removed, diverticulosis and hemorrhoids)  YOU HAD AN ENDOSCOPIC PROCEDURE TODAY AT THE [facility]:   Refer to the procedure report that was given to you for any specific questions about what was found during the examination.  If the procedure report does not answer your questions, please call your gastroenterologist to clarify.  If you requested that your care partner not be given the details of your procedure findings, then the procedure report has been included in a sealed envelope for you to review at your convenience later.  YOU SHOULD EXPECT: Some feelings of bloating in the abdomen. Passage of more gas than usual.  Walking can help get rid of the air that was put into your GI tract during the procedure and reduce the bloating. If you had a lower endoscopy (such as a colonoscopy or flexible sigmoidoscopy) you may notice spotting of blood in your stool or on the toilet paper. If you underwent a bowel prep for your procedure, you may not have a normal bowel movement for a few days.  Please Note:  You might notice some irritation and congestion in your nose or some drainage.  This is from the oxygen used during your procedure.  There is no need for concern and it should clear up in a  day or so.  SYMPTOMS TO REPORT IMMEDIATELY:  Following lower endoscopy (colonoscopy or flexible sigmoidoscopy):  Excessive amounts of blood in the stool  Significant tenderness or worsening of abdominal pains  Swelling of the abdomen that is new, acute  Fever of 100F or higher   For urgent  or emergent issues, a gastroenterologist can be reached at any hour by calling [phone]. Do not use MyChart messaging for urgent concerns.    DIET:  We do recommend a small meal at first, but then you may proceed to your regular diet.  Drink plenty of fluids but you should avoid alcoholic beverages for 24 hours.  ACTIVITY:  You should plan to take it easy for the rest of today and you should NOT DRIVE or use heavy machinery until tomorrow (because of the sedation medicines used during the test).    FOLLOW UP: Our staff will call the number listed on your records the next business day following your procedure.  We will call around 7:15- 8:00 am to check on you and address any questions or concerns that you may have regarding the information given to you following your procedure. If we do not reach you, we will leave a message.     If any biopsies were taken you will be contacted by phone or by letter within the next 1-3 weeks.  Please call us at [phone] if you have not heard about the biopsies in 3 weeks.    SIGNATURES/CONFIDENTIALITY: You and/or your care partner have signed paperwork which will be entered into your electronic medical record.  These signatures attest to the fact that that the information above on your After Visit Summary has been reviewed and is understood.  Full responsibility of the confidentiality of this discharge information lies with you and/or your care-partner.

## 2023-06-20 NOTE — Progress Notes (Signed)
Pt's states no medical or surgical changes since previsit or office visit. 

## 2023-06-20 NOTE — Progress Notes (Signed)
To pacu, VSS. Report to [email or web address] 

## 2023-06-20 NOTE — Op Note (Signed)
[facility] Patient Name: Mary Humphrey Procedure Date: 06/20/2023 9:03 AM MRN: [id number] Endoscopist: Wilhemina Bonito. Marina Goodell , MD, [id number] Age: 67 Referring MD:  Date of Birth: [birth year] Gender: Female Account #: [id number] Procedure:                Colonoscopy with cold snare polypectomy x 1. Indications:              High risk colon cancer surveillance: Personal                            history of non-advanced adenoma, High risk colon                            cancer surveillance: Personal history of sessile                            serrated colon polyp (less than 10 mm in size) with                            no dysplasia. Previous exam November 2015 Medicines:                Monitored Anesthesia Care Procedure:                Pre-Anesthesia Assessment:                           - Prior to the procedure, a History and Physical                            was performed, and patient medications and                            allergies were reviewed. The patient's tolerance of                            previous anesthesia was also reviewed. The risks                            and benefits of the procedure and the sedation                            options and risks were discussed with the patient.                            All questions were answered, and informed consent                            was obtained. Prior Anticoagulants: The patient has                            taken no anticoagulant or antiplatelet agents.                            After reviewing the risks and benefits, the patient  was deemed in satisfactory condition to undergo the                            procedure.                           After obtaining informed consent, the colonoscope                            was passed under direct vision. Throughout the                            procedure, the patient's blood pressure, pulse, and                             oxygen saturations were monitored continuously. The                            Olympus Scope SN [id number] was introduced through the                            anus and advanced to the the cecum, identified by                            appendiceal orifice and ileocecal valve. The                            ileocecal valve, appendiceal orifice, and rectum                            were photographed. The quality of the bowel                            preparation was excellent. The colonoscopy was                            performed without difficulty. The patient tolerated                            the procedure well. The bowel preparation used was                            SUPREP via split dose instruction. Scope In: 9:12:33 AM Scope Out: 9:24:42 AM Scope Withdrawal Time: 0 hours 9 minutes 10 seconds  Total Procedure Duration: 0 hours 12 minutes 9 seconds  Findings:                 A 1 mm polyp was found in the ascending colon. The                            polyp was removed with a cold snare. Resection and                            retrieval were complete.  A few small-mouthed diverticula were found in the                            sigmoid colon.                           Internal hemorrhoids were found during retroflexion.                           The exam was otherwise without abnormality on                            direct and retroflexion views. Complications:            No immediate complications. Estimated blood loss:                            None. Estimated Blood Loss:     Estimated blood loss: none. Impression:               - One 1 mm polyp in the ascending colon, removed                            with a cold snare. Resected and retrieved.                           - Diverticulosis in the sigmoid colon.                           - Internal hemorrhoids.                           - The examination was otherwise normal on direct                             and retroflexion views. Recommendation:           - Repeat colonoscopy in 7-10 years for surveillance.                           - Patient has a contact number available for                            emergencies. The signs and symptoms of potential                            delayed complications were discussed with the                            patient. Return to normal activities tomorrow.                            Written discharge instructions were provided to the                            patient.                           -  Resume previous diet.                           - Continue present medications.                           - Await pathology results. Wilhemina Bonito. Marina Goodell, MD 06/20/2023 9:33:35 AM This report has been signed electronically.

## 2023-06-20 NOTE — Progress Notes (Signed)
Called to room to assist during endoscopic procedure.  Patient ID and intended procedure confirmed with present staff. Received instructions for my participation in the procedure from the performing physician.  

## 2023-06-21 ENCOUNTER — Telehealth: Payer: Self-pay | Admitting: *Deleted

## 2023-06-21 NOTE — Telephone Encounter (Signed)
  Follow up Call-     06/20/2023    8:14 AM  Call back number  Post procedure Call Back phone  # [phone]  Permission to leave phone message Yes     Patient questions:  Do you have a fever, pain , or abdominal swelling? No. Pain Score  0 *  Have you tolerated food without any problems? Yes.    Have you been able to return to your normal activities? Yes.    Do you have any questions about your discharge instructions: Diet   No. Medications  No. Follow up visit  No.  Do you have questions or concerns about your Care? No.  Actions: * If pain score is 4 or above: No action needed, pain <4.

## 2023-06-22 ENCOUNTER — Encounter: Payer: Self-pay | Admitting: Internal Medicine

## 2023-06-22 LAB — SURGICAL PATHOLOGY

## 2023-06-29 ENCOUNTER — Ambulatory Visit: Payer: Medicare PPO

## 2023-07-13 ENCOUNTER — Ambulatory Visit: Payer: Medicare PPO

## 2023-07-18 ENCOUNTER — Ambulatory Visit: Payer: Medicare PPO

## 2023-07-21 ENCOUNTER — Ambulatory Visit: Payer: Medicare PPO

## 2023-11-17 ENCOUNTER — Ambulatory Visit

## 2023-11-25 ENCOUNTER — Other Ambulatory Visit (HOSPITAL_COMMUNITY): Payer: Self-pay

## 2023-11-26 ENCOUNTER — Other Ambulatory Visit (HOSPITAL_BASED_OUTPATIENT_CLINIC_OR_DEPARTMENT_OTHER): Payer: Self-pay

## 2023-11-26 ENCOUNTER — Other Ambulatory Visit (HOSPITAL_COMMUNITY): Payer: Self-pay

## 2023-11-26 MED ORDER — CARBIDOPA-LEVODOPA 25-100 MG PO TABS
1.0000 | ORAL_TABLET | Freq: Two times a day (BID) | ORAL | 2 refills | Status: DC
  Filled 2023-11-26 – 2023-11-28 (×2): qty 180, 90d supply, fill #0
  Filled 2024-02-13: qty 180, 90d supply, fill #1

## 2023-11-26 MED ORDER — MONTELUKAST SODIUM 10 MG PO TABS
10.0000 mg | ORAL_TABLET | Freq: Every evening | ORAL | 5 refills | Status: AC
  Filled 2023-11-26: qty 30, 30d supply, fill #0

## 2023-11-26 MED ORDER — FLUTICASONE FUROATE-VILANTEROL 100-25 MCG/ACT IN AEPB
1.0000 | INHALATION_SPRAY | Freq: Every day | RESPIRATORY_TRACT | 3 refills | Status: AC
  Filled 2023-11-26: qty 60, 60d supply, fill #0
  Filled 2023-11-28 – 2023-12-09 (×2): qty 60, 30d supply, fill #0
  Filled 2024-01-03: qty 60, 30d supply, fill #1
  Filled 2024-02-13: qty 60, 30d supply, fill #2

## 2023-11-26 MED ORDER — CETIRIZINE HCL 10 MG PO TABS
10.0000 mg | ORAL_TABLET | Freq: Every day | ORAL | 5 refills | Status: AC | PRN
  Filled 2023-11-26: qty 30, 30d supply, fill #0

## 2023-11-28 ENCOUNTER — Other Ambulatory Visit: Payer: Self-pay

## 2023-11-28 ENCOUNTER — Encounter (HOSPITAL_COMMUNITY): Payer: Self-pay

## 2023-11-28 ENCOUNTER — Other Ambulatory Visit (HOSPITAL_COMMUNITY): Payer: Self-pay

## 2023-11-28 MED ORDER — XHANCE 93 MCG/ACT NA EXHU
1.0000 | INHALANT_SUSPENSION | Freq: Two times a day (BID) | NASAL | 6 refills | Status: AC
Start: 2023-09-13 — End: ?
  Filled 2023-11-28 – 2023-12-12 (×2): qty 16, 30d supply, fill #0

## 2023-11-28 MED ORDER — DUPIXENT 300 MG/2ML ~~LOC~~ SOAJ: SUBCUTANEOUS | 11 refills | Status: AC

## 2023-11-28 NOTE — Progress Notes (Signed)
 Pharmacy Patient Advocate Encounter  Insurance verification completed.   The patient is insured through [facility] ADV[employer]   Ran test claim for Dupixent. Co-pay is $455.86.  This test claim was processed through [facility]- copay amounts may vary at other pharmacies due to pharmacy/plan contracts, or as the patient moves through the different stages of their insurance plan.

## 2023-11-29 ENCOUNTER — Other Ambulatory Visit: Payer: Self-pay

## 2023-11-29 ENCOUNTER — Other Ambulatory Visit (HOSPITAL_COMMUNITY): Payer: Self-pay

## 2023-11-29 ENCOUNTER — Other Ambulatory Visit (HOSPITAL_BASED_OUTPATIENT_CLINIC_OR_DEPARTMENT_OTHER): Payer: Self-pay

## 2023-11-29 NOTE — Progress Notes (Signed)
 Patient will not be getting due to high price. Dis-enrolling.

## 2023-12-01 ENCOUNTER — Other Ambulatory Visit: Payer: Self-pay

## 2023-12-01 ENCOUNTER — Other Ambulatory Visit (HOSPITAL_COMMUNITY): Payer: Self-pay

## 2023-12-01 MED ORDER — EPINEPHRINE 0.3 MG/0.3ML IJ SOAJ
INTRAMUSCULAR | 1 refills | Status: AC
  Filled 2023-12-01: qty 2, 1d supply, fill #0

## 2023-12-05 ENCOUNTER — Other Ambulatory Visit: Payer: Self-pay

## 2023-12-05 ENCOUNTER — Other Ambulatory Visit (HOSPITAL_COMMUNITY): Payer: Self-pay

## 2023-12-09 ENCOUNTER — Other Ambulatory Visit (HOSPITAL_COMMUNITY): Payer: Self-pay

## 2023-12-09 ENCOUNTER — Other Ambulatory Visit: Payer: Self-pay

## 2023-12-12 ENCOUNTER — Ambulatory Visit
Admission: RE | Admit: 2023-12-12 | Discharge: 2023-12-12 | Disposition: A | Discharge status: Home / Self Care | Source: Ambulatory Visit | Attending: Family Medicine | Admitting: Family Medicine

## 2023-12-12 ENCOUNTER — Other Ambulatory Visit (HOSPITAL_COMMUNITY): Payer: Self-pay

## 2023-12-12 ENCOUNTER — Other Ambulatory Visit: Payer: Self-pay

## 2023-12-12 DIAGNOSIS — Z1231 Encounter for screening mammogram for malignant neoplasm of breast: Secondary | ICD-10-CM

## 2024-04-23 ENCOUNTER — Other Ambulatory Visit (HOSPITAL_COMMUNITY): Payer: Self-pay

## 2024-04-23 ENCOUNTER — Other Ambulatory Visit: Payer: Self-pay | Admitting: Neurology

## 2024-04-23 MED ORDER — CARBIDOPA-LEVODOPA 25-100 MG PO TABS
1.0000 | ORAL_TABLET | Freq: Two times a day (BID) | ORAL | 3 refills | Status: AC
  Filled 2024-04-23: qty 180, 90d supply, fill #0

## 2024-06-18 ENCOUNTER — Encounter: Payer: Self-pay | Admitting: Neurology

## 2024-06-18 ENCOUNTER — Ambulatory Visit (INDEPENDENT_AMBULATORY_CARE_PROVIDER_SITE_OTHER): Payer: Medicare PPO | Admitting: Neurology

## 2024-06-18 VITALS — BP 132/80 | HR 62 | Ht 67.0 in | Wt 162.0 lb

## 2024-06-18 DIAGNOSIS — G20A1 Parkinson's disease without dyskinesia, without mention of fluctuations: Secondary | ICD-10-CM | POA: Diagnosis not present

## 2024-06-18 NOTE — Progress Notes (Signed)
 Follow-up Visit   Date: 06/18/24   Mary Humphrey MRN: [id number] DOB: Mar 25, 1956   Interim History: Mary Humphrey is a 68 y.o. right-handed Caucasian female  returning to the clinic for follow-up of parkinson's disease.  The patient was accompanied to the clinic by self.  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  Stable without worsening symptoms.  Exam shows trace right arm ridigity and mild bradykinesia with finger tapping.  - Continue sinemet  25/100 1 tablet twice daily.   OK to take extra dose as needed.  Return to clinic in 1 year  -------------------------------------------------- History of present illness: Starting around 2019, she began noticing that her right hand felt more clumsy and movements were not the same, as compared to the left.  Over the past year, her ability to grip is weaker and she has difficulty writing or painting with her grandchildren.  Fine finger movements, such as applying makeup, is difficult. Small buttons are tricky.  She denies tremor or stiffness. She denies numbness, tingling, or neck pain.  MRI cervical spine from March 2022 shows mild degenerative changes at C4-5 and C5-6 causing mild canal stenosis.  No RLS, constipation, or lack of smell.  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  DAT scan showed reduced dopamine uptake.    UPDATE 09/03/2021:  She is here for follow-up visit.  She started sinemet  1 tablet TID and has been tolerating it well. It has helped her fine motor tasks and she is able to write better.  Movements are not longer clumsy. No tremor, weakness, or gait difficulty. She stays active and going to the gym regularly.   UPDATE 06/11/2022:  She is here for follow-up visit.  She has been doing well and denies any new complaints.  She is tolerating sinemet  1 tablet TID and feels this continues to allow her to use her hand well, including hand writing.  No interval falls,  illnesses, or new medications.  No tremor, stiffness, or slowed movements.  Balance is good. No problems with constipation and since starting Dupixent  for allergies, scent of smell has improved.   UPDATE 06/13/2023:  She is here for follow-up visit.  She has been doing well for the past year and takes sinemet  25/100 twice daily at 7:30a and 11:30a which controls symptoms.  She rarely takes an extra dose.  She denies any off symptoms.  Balance is good, no falls.  She has notice trace right hand tremor.    UPDATE 06/18/2024:  She is here for 1 year follow-up visit.  She reports doing well.  She is compliant with sinemet  25/100 1 tablet twice daily and very seldomly takes an extra tablet.  No imbalance or new neurological symptoms.  She is working 10 hr per week at an [employer] operational [employer].   Medications:  Current Outpatient Medications on File Prior to Visit  Medication Sig Dispense Refill   BREO ELLIPTA  100-25 MCG/INH AEPB INL 1 PUFF PO QD  5   CALCIUM  PO Take 1 tablet by mouth daily.     carbidopa -levodopa  (SINEMET  IR) 25-100 MG tablet Take 1 tablet by mouth 2 (two) times daily at 10 AM and 5 PM. 180 tablet 3   cetirizine  (ZYRTEC ) 10 MG tablet Take 1 tablet (10 mg total) by mouth daily as needed. 30 tablet 5   Cholecalciferol (VITAMIN D -3 PO) Take 1 tablet by mouth daily.     Dupilumab  (DUPIXENT ) 300 MG/2ML SOAJ Inject 2ml subcutaneously every two weeks  for 28 days 4 mL 11   EPINEPHrine  (EPIPEN  2-PAK) 0.3 mg/0.3 mL IJ SOAJ injection Inject contents of 1 pen as needed for allergic reaction. 2 each 1   EPINEPHrine  0.3 mg/0.3 mL IJ SOAJ injection Inject into the muscle.     fluticasone  furoate-vilanterol (BREO ELLIPTA ) 100-25 MCG/ACT AEPB Inhale 1 puff into the lungs daily. 60 each 3   Multiple Vitamins-Minerals (WOMENS MULTIVITAMIN PO) Take 1 tablet by mouth daily.     carbidopa -levodopa  (SINEMET  IR) 25-100 MG tablet Take 1 tablet by mouth 2 (two) times daily. (Patient not taking: Reported on 06/18/2024) 180  tablet 3   cetirizine  (ZYRTEC ) 10 MG tablet Take 1 tablet by mouth daily as needed. (Patient not taking: Reported on 06/18/2024)     DUPIXENT  300 MG/2ML prefilled syringe Inject 300 mg into the skin every 14 (fourteen) days. (Patient not taking: Reported on 06/18/2024)     Fluticasone  Propionate (XHANCE ) 93 MCG/ACT EXHU Inhale 1 puff into the lungs 2 (two) times daily. (Patient not taking: Reported on 06/18/2024) 16 mL 6   montelukast  (SINGULAIR ) 10 MG tablet Take 1 tablet (10 mg total) by mouth every evening. (Patient not taking: Reported on 06/18/2024) 30 tablet 5   No current facility-administered medications on file prior to visit.    Allergies:  Allergies  Allergen Reactions   Doxycycline  Diarrhea and Nausea And Vomiting    Vital Signs:  BP 132/80   Pulse 62   Ht 5' 7 (1.702 m)   Wt 162 lb (73.5 kg)   SpO2 99%   BMI 25.37 kg/m    Neurological Exam: MENTAL STATUS including orientation to time, place, person, recent and remote memory, attention span and concentration, language, and fund of knowledge is normal.  Speech is not dysarthric.  Blunted affect.   CRANIAL NERVES:  Pupils equal round and reactive to light.  Normal conjugate, extra-ocular eye movements in all directions of gaze.  No ptosis.    MOTOR:  Motor strength is 5/5 in all extremities.  Very fine tremor with hands out stretched, not present at rest.  Right hand with trace rigidity (stable).  Mild right pronator drift.   MSRs:                                           Right        Left brachioradialis 3+  2+  biceps 3+  2+  triceps 2+  2+  patellar 3+  2+  ankle jerk 2+  2+   COORDINATION/GAIT:   Mild bradykinesia with reduced amplitude and rate of finger tapping (stable) on the right.   Gait narrow based and stable, reduced arm swing on the right.   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   There are a few small scattered T2/FLAIR hyperintense signal changes within the cerebral white matter, the largest within the left subinsular white matter measuring 5 mm. These signal changes are nonspecific, but most often secondary to chronic small vessel ischemia. Potential alternative considerations include sequela of a prior infectious/inflammatory process, sequela of chronic migraine headaches or sequela of a demyelinating process (such as multiple sclerosis).   Otherwise unremarkable noncontrast MRI appearance of the brain for age.  DAT  scan 05/06/2021: Decreased counts in the bilateral posterior striatum and head of the LEFT caudate nucleus is a pattern typical of Parkinsonian syndrome pathology.   Of note, DaTSCAN  is not diagnostic of Parkinsonian syndromes, which remains a clinical diagnosis. DaTscan  is an adjuvant test to aid I the clinical diagnosis of Parkinsonian syndromes.     Thank you for allowing me to participate in patient's care.  If I can answer any additional questions, I would be pleased to do so.    Sincerely,    Lorena Clearman K. Tobie, DO

## 2024-07-24 ENCOUNTER — Other Ambulatory Visit (HOSPITAL_COMMUNITY): Payer: Self-pay

## 2025-06-24 ENCOUNTER — Ambulatory Visit: Admitting: Neurology
